# Patient Record
Sex: Female | Born: 1950 | Race: Black or African American | Hispanic: No | Smoking: Never smoker
Health system: Southern US, Community
[De-identification: ages and names within clinical notes are randomized; demographics above are authoritative.]

## PROBLEM LIST (undated history)

## (undated) DIAGNOSIS — N63 Unspecified lump in unspecified breast: Secondary | ICD-10-CM

## (undated) DIAGNOSIS — Z87898 Personal history of other specified conditions: Secondary | ICD-10-CM

## (undated) DIAGNOSIS — E785 Hyperlipidemia, unspecified: Secondary | ICD-10-CM

## (undated) DIAGNOSIS — H409 Unspecified glaucoma: Secondary | ICD-10-CM

## (undated) DIAGNOSIS — H25019 Cortical age-related cataract, unspecified eye: Secondary | ICD-10-CM

## (undated) DIAGNOSIS — I10 Essential (primary) hypertension: Secondary | ICD-10-CM

## (undated) DIAGNOSIS — G43909 Migraine, unspecified, not intractable, without status migrainosus: Secondary | ICD-10-CM

## (undated) DIAGNOSIS — R112 Nausea with vomiting, unspecified: Secondary | ICD-10-CM

## (undated) DIAGNOSIS — Z9229 Personal history of other drug therapy: Secondary | ICD-10-CM

## (undated) DIAGNOSIS — Z9889 Other specified postprocedural states: Secondary | ICD-10-CM

## (undated) DIAGNOSIS — Z9289 Personal history of other medical treatment: Secondary | ICD-10-CM

## (undated) DIAGNOSIS — F32A Depression, unspecified: Secondary | ICD-10-CM

## (undated) DIAGNOSIS — E119 Type 2 diabetes mellitus without complications: Secondary | ICD-10-CM

## (undated) DIAGNOSIS — E079 Disorder of thyroid, unspecified: Secondary | ICD-10-CM

## (undated) DIAGNOSIS — Z8742 Personal history of other diseases of the female genital tract: Secondary | ICD-10-CM

## (undated) DIAGNOSIS — K589 Irritable bowel syndrome without diarrhea: Secondary | ICD-10-CM

## (undated) DIAGNOSIS — G2581 Restless legs syndrome: Secondary | ICD-10-CM

## (undated) DIAGNOSIS — M1990 Unspecified osteoarthritis, unspecified site: Secondary | ICD-10-CM

## (undated) DIAGNOSIS — J45909 Unspecified asthma, uncomplicated: Secondary | ICD-10-CM

## (undated) HISTORY — DX: Personal history of other medical treatment: Z92.89

## (undated) HISTORY — DX: Type 2 diabetes mellitus without complications: E11.9

## (undated) HISTORY — DX: Cortical age-related cataract, unspecified eye: H25.019

## (undated) HISTORY — DX: Depression, unspecified: F32.A

## (undated) HISTORY — DX: Disorder of thyroid, unspecified: E07.9

## (undated) HISTORY — DX: Personal history of other diseases of the female genital tract: Z87.42

## (undated) HISTORY — DX: Essential (primary) hypertension: I10

## (undated) HISTORY — DX: Personal history of other specified conditions: Z87.898

## (undated) HISTORY — DX: Other specified postprocedural states: R11.2

## (undated) HISTORY — DX: Migraine, unspecified, not intractable, without status migrainosus: G43.909

## (undated) HISTORY — DX: Other specified postprocedural states: Z98.890

## (undated) HISTORY — DX: Unspecified glaucoma: H40.9

## (undated) HISTORY — DX: Hyperlipidemia, unspecified: E78.5

## (undated) HISTORY — DX: Personal history of other drug therapy: Z92.29

## (undated) HISTORY — DX: Unspecified lump in unspecified breast: N63.0

## (undated) HISTORY — DX: Restless legs syndrome: G25.81

## (undated) HISTORY — DX: Unspecified asthma, uncomplicated: J45.909

## (undated) HISTORY — DX: Irritable bowel syndrome, unspecified: K58.9

## (undated) HISTORY — DX: Unspecified osteoarthritis, unspecified site: M19.90

---

## 1981-01-06 DIAGNOSIS — N8000 Endometriosis of the uterus, unspecified: Secondary | ICD-10-CM

## 1981-01-06 HISTORY — DX: Endometriosis of the uterus, unspecified: N80.00

## 2009-01-06 DIAGNOSIS — D3500 Benign neoplasm of unspecified adrenal gland: Secondary | ICD-10-CM

## 2009-01-06 HISTORY — DX: Benign neoplasm of unspecified adrenal gland: D35.00

## 2010-10-07 DIAGNOSIS — G4730 Sleep apnea, unspecified: Secondary | ICD-10-CM

## 2010-10-07 HISTORY — DX: Sleep apnea, unspecified: G47.30

## 2011-02-21 DIAGNOSIS — L719 Rosacea, unspecified: Secondary | ICD-10-CM

## 2011-02-21 DIAGNOSIS — I10 Essential (primary) hypertension: Secondary | ICD-10-CM | POA: Insufficient documentation

## 2011-02-21 DIAGNOSIS — K219 Gastro-esophageal reflux disease without esophagitis: Secondary | ICD-10-CM

## 2011-02-21 HISTORY — DX: Gastro-esophageal reflux disease without esophagitis: K21.9

## 2011-02-21 HISTORY — DX: Rosacea, unspecified: L71.9

## 2011-11-07 DIAGNOSIS — Z8759 Personal history of other complications of pregnancy, childbirth and the puerperium: Secondary | ICD-10-CM

## 2011-11-07 HISTORY — DX: Personal history of other complications of pregnancy, childbirth and the puerperium: Z87.59

## 2014-06-22 DIAGNOSIS — R9082 White matter disease, unspecified: Secondary | ICD-10-CM

## 2014-06-22 HISTORY — DX: White matter disease, unspecified: R90.82

## 2014-07-06 DIAGNOSIS — E876 Hypokalemia: Secondary | ICD-10-CM | POA: Insufficient documentation

## 2014-07-11 DIAGNOSIS — N182 Chronic kidney disease, stage 2 (mild): Secondary | ICD-10-CM

## 2014-07-11 HISTORY — DX: Chronic kidney disease, stage 2 (mild): N18.2

## 2014-08-04 DIAGNOSIS — E039 Hypothyroidism, unspecified: Secondary | ICD-10-CM | POA: Insufficient documentation

## 2015-10-08 DIAGNOSIS — Z6839 Body mass index (BMI) 39.0-39.9, adult: Secondary | ICD-10-CM | POA: Insufficient documentation

## 2019-08-18 DIAGNOSIS — H7202 Central perforation of tympanic membrane, left ear: Secondary | ICD-10-CM | POA: Insufficient documentation

## 2020-02-23 DIAGNOSIS — M5000 Cervical disc disorder with myelopathy, unspecified cervical region: Secondary | ICD-10-CM | POA: Insufficient documentation

## 2020-02-23 DIAGNOSIS — M7500 Adhesive capsulitis of unspecified shoulder: Secondary | ICD-10-CM | POA: Insufficient documentation

## 2020-02-23 DIAGNOSIS — M797 Fibromyalgia: Secondary | ICD-10-CM | POA: Insufficient documentation

## 2020-02-23 DIAGNOSIS — M5412 Radiculopathy, cervical region: Secondary | ICD-10-CM | POA: Insufficient documentation

## 2020-11-14 ENCOUNTER — Inpatient Hospital Stay: Payer: Medicare Other

## 2020-11-14 ENCOUNTER — Other Ambulatory Visit: Payer: Self-pay

## 2020-11-14 ENCOUNTER — Encounter: Payer: Self-pay | Admitting: Oncology

## 2020-11-14 ENCOUNTER — Inpatient Hospital Stay: Payer: Medicare Other | Attending: Oncology | Admitting: Oncology

## 2020-11-14 VITALS — BP 94/67 | HR 87 | Temp 98.7°F | Resp 16 | Wt 210.0 lb

## 2020-11-14 DIAGNOSIS — Z833 Family history of diabetes mellitus: Secondary | ICD-10-CM | POA: Insufficient documentation

## 2020-11-14 DIAGNOSIS — Z8639 Personal history of other endocrine, nutritional and metabolic disease: Secondary | ICD-10-CM

## 2020-11-14 DIAGNOSIS — R109 Unspecified abdominal pain: Secondary | ICD-10-CM | POA: Diagnosis not present

## 2020-11-14 DIAGNOSIS — Z880 Allergy status to penicillin: Secondary | ICD-10-CM | POA: Insufficient documentation

## 2020-11-14 DIAGNOSIS — K589 Irritable bowel syndrome without diarrhea: Secondary | ICD-10-CM | POA: Insufficient documentation

## 2020-11-14 DIAGNOSIS — R110 Nausea: Secondary | ICD-10-CM | POA: Insufficient documentation

## 2020-11-14 DIAGNOSIS — I129 Hypertensive chronic kidney disease with stage 1 through stage 4 chronic kidney disease, or unspecified chronic kidney disease: Secondary | ICD-10-CM | POA: Insufficient documentation

## 2020-11-14 DIAGNOSIS — K219 Gastro-esophageal reflux disease without esophagitis: Secondary | ICD-10-CM | POA: Insufficient documentation

## 2020-11-14 DIAGNOSIS — E1122 Type 2 diabetes mellitus with diabetic chronic kidney disease: Secondary | ICD-10-CM | POA: Diagnosis not present

## 2020-11-14 DIAGNOSIS — Z885 Allergy status to narcotic agent status: Secondary | ICD-10-CM | POA: Insufficient documentation

## 2020-11-14 DIAGNOSIS — E785 Hyperlipidemia, unspecified: Secondary | ICD-10-CM | POA: Insufficient documentation

## 2020-11-14 DIAGNOSIS — D649 Anemia, unspecified: Secondary | ICD-10-CM | POA: Diagnosis not present

## 2020-11-14 DIAGNOSIS — R5383 Other fatigue: Secondary | ICD-10-CM | POA: Insufficient documentation

## 2020-11-14 DIAGNOSIS — R140 Abdominal distension (gaseous): Secondary | ICD-10-CM | POA: Insufficient documentation

## 2020-11-14 DIAGNOSIS — Z881 Allergy status to other antibiotic agents status: Secondary | ICD-10-CM | POA: Insufficient documentation

## 2020-11-14 DIAGNOSIS — Z836 Family history of other diseases of the respiratory system: Secondary | ICD-10-CM | POA: Diagnosis not present

## 2020-11-14 DIAGNOSIS — Z8269 Family history of other diseases of the musculoskeletal system and connective tissue: Secondary | ICD-10-CM | POA: Diagnosis not present

## 2020-11-14 DIAGNOSIS — R768 Other specified abnormal immunological findings in serum: Secondary | ICD-10-CM | POA: Diagnosis not present

## 2020-11-14 DIAGNOSIS — Z8349 Family history of other endocrine, nutritional and metabolic diseases: Secondary | ICD-10-CM | POA: Insufficient documentation

## 2020-11-14 DIAGNOSIS — N189 Chronic kidney disease, unspecified: Secondary | ICD-10-CM | POA: Diagnosis not present

## 2020-11-14 DIAGNOSIS — Z818 Family history of other mental and behavioral disorders: Secondary | ICD-10-CM | POA: Insufficient documentation

## 2020-11-14 DIAGNOSIS — Z803 Family history of malignant neoplasm of breast: Secondary | ICD-10-CM | POA: Insufficient documentation

## 2020-11-14 DIAGNOSIS — Z8249 Family history of ischemic heart disease and other diseases of the circulatory system: Secondary | ICD-10-CM | POA: Insufficient documentation

## 2020-11-14 DIAGNOSIS — Z882 Allergy status to sulfonamides status: Secondary | ICD-10-CM | POA: Insufficient documentation

## 2020-11-14 LAB — IRON AND TIBC
Iron: 85 ug/dL (ref 28–170)
Saturation Ratios: 22 % (ref 10.4–31.8)
TIBC: 391 ug/dL (ref 250–450)
UIBC: 306 ug/dL

## 2020-11-14 LAB — CBC WITH DIFFERENTIAL/PLATELET
Abs Immature Granulocytes: 0.03 10*3/uL (ref 0.00–0.07)
Basophils Absolute: 0 10*3/uL (ref 0.0–0.1)
Basophils Relative: 1 %
Eosinophils Absolute: 0 10*3/uL (ref 0.0–0.5)
Eosinophils Relative: 1 %
HCT: 29 % — ABNORMAL LOW (ref 36.0–46.0)
Hemoglobin: 8.7 g/dL — ABNORMAL LOW (ref 12.0–15.0)
Immature Granulocytes: 1 %
Lymphocytes Relative: 45 %
Lymphs Abs: 2 10*3/uL (ref 0.7–4.0)
MCH: 28.5 pg (ref 26.0–34.0)
MCHC: 30 g/dL (ref 30.0–36.0)
MCV: 95.1 fL (ref 80.0–100.0)
Monocytes Absolute: 0.3 10*3/uL (ref 0.1–1.0)
Monocytes Relative: 8 %
Neutro Abs: 1.8 10*3/uL (ref 1.7–7.7)
Neutrophils Relative %: 44 %
Platelets: 314 10*3/uL (ref 150–400)
RBC: 3.05 MIL/uL — ABNORMAL LOW (ref 3.87–5.11)
RDW: 17.5 % — ABNORMAL HIGH (ref 11.5–15.5)
WBC: 4.2 10*3/uL (ref 4.0–10.5)
nRBC: 6 % — ABNORMAL HIGH (ref 0.0–0.2)

## 2020-11-14 LAB — COMPREHENSIVE METABOLIC PANEL
ALT: 16 U/L (ref 0–44)
AST: 23 U/L (ref 15–41)
Albumin: 3.6 g/dL (ref 3.5–5.0)
Alkaline Phosphatase: 58 U/L (ref 38–126)
Anion gap: 7 (ref 5–15)
BUN: 21 mg/dL (ref 8–23)
CO2: 28 mmol/L (ref 22–32)
Calcium: 9.4 mg/dL (ref 8.9–10.3)
Chloride: 95 mmol/L — ABNORMAL LOW (ref 98–111)
Creatinine, Ser: 1.54 mg/dL — ABNORMAL HIGH (ref 0.44–1.00)
GFR, Estimated: 37 mL/min — ABNORMAL LOW (ref >60)
Glucose, Bld: 75 mg/dL (ref 70–99)
Potassium: 4.2 mmol/L (ref 3.5–5.1)
Sodium: 130 mmol/L — ABNORMAL LOW (ref 135–145)
Total Bilirubin: 0.5 mg/dL (ref 0.3–1.2)
Total Protein: 11.2 g/dL — ABNORMAL HIGH (ref 6.5–8.1)

## 2020-11-14 LAB — VITAMIN B12: Vitamin B-12: 277 pg/mL (ref 180–914)

## 2020-11-14 LAB — RETICULOCYTES
Immature Retic Fract: 28.3 % — ABNORMAL HIGH (ref 2.3–15.9)
RBC.: 3.05 MIL/uL — ABNORMAL LOW (ref 3.87–5.11)
Retic Count, Absolute: 60.7 10*3/uL (ref 19.0–186.0)
Retic Ct Pct: 2 % (ref 0.4–3.1)

## 2020-11-14 LAB — FERRITIN: Ferritin: 70 ng/mL (ref 11–307)

## 2020-11-14 LAB — FOLATE: Folate: 14.2 ng/mL (ref >5.9)

## 2020-11-14 LAB — LACTATE DEHYDROGENASE: LDH: 133 U/L (ref 98–192)

## 2020-11-14 NOTE — Progress Notes (Signed)
Hematology/Oncology Consult note [facility] [phone] [phone]  Patient Care Team: Chetty, Golden Pop, MD as PCP - General (Family Medicine)   Name of the patient: Karen Mcintosh  [id number]  1950-07-16    Reason for referral- anemia   Referring physician- Dr. Manuela Schwartz  Date of visit: 11/14/20   History of presenting illness- Patient is a 70 year old female with a past medical history significant for GERD type 2 diabetes hypertension hyperlipidemia among other medical problems.  She has been referred to [place] for anemia.  Most recently her H&H was 8.3/27.3 and has been gradually drifting down over the last 1 year.  In February 2021 her hemoglobin was 11.8 and then went down to 9.8 10.5 and presently 8.3.  Iron studies in July 2022 was normal.  TSH in September 2022 was normal.  CMP has shown elevated total protein levels since February 2021 and presently they are high at 10.1.  Creatinine mildly abnormal at 1.2.  Patient currently complains of fatigue.   Appetite and weight have remained stable.  She also complains of chronic nausea bloating and constipation.  ECOG PS- 1  Pain scale- 0   Review of systems- Review of Systems  Constitutional:  Positive for malaise/fatigue. Negative for chills, fever and weight loss.  HENT:  Negative for congestion, ear discharge and nosebleeds.   Eyes:  Negative for blurred vision.  Respiratory:  Negative for cough, hemoptysis, sputum production, shortness of breath and wheezing.   Cardiovascular:  Negative for chest pain, palpitations, orthopnea and claudication.  Gastrointestinal:  Positive for abdominal pain. Negative for blood in stool, constipation, diarrhea, heartburn, melena, nausea and vomiting.  Genitourinary:  Negative for dysuria, flank pain, frequency, hematuria and urgency.  Musculoskeletal:  Negative for back pain, joint pain and myalgias.  Skin:  Negative for rash.  Neurological:  Negative  for dizziness, tingling, focal weakness, seizures, weakness and headaches.  Endo/Heme/Allergies:  Does not bruise/bleed easily.  Psychiatric/Behavioral:  Negative for depression and suicidal ideas. The patient does not have insomnia.    Allergies  Allergen Reactions   Lidocaine-Methyl Sal-Camphor Shortness Of Breath and Swelling   Amitriptyline Swelling   Dilaudid [Hydromorphone] Itching   Eggs Or Egg-Derived Products Nausea And Vomiting   Lactose Intolerance (Gi) Nausea And Vomiting    vomiting   Mercaptobenzothiazole Itching    Depends on where it is used as to what side effect   Metformin And Related Other (See Comments)    constipation   Sulfa Antibiotics Hives and Swelling    Swelling of joints Tolerated oral cephalosporins in the past   Latex Rash    Also rubber, spandex, lycra   Penicillins Rash    There are no problems to display for this patient.    Past Medical History:  Diagnosis Date   Arthritis    Asthma    Breast mass in female    in 2019 and 2020   Cataract cortical, senile    CRF (chronic renal failure), stage 2 (mild) 07/11/2014   Depression    Diabetes mellitus type 2, uncomplicated ([place])    Endometriosis of uterus 01/06/1981   GERD (gastroesophageal reflux disease) 02/21/2011   Glaucoma    History of abnormal cervical Pap smear    History of blood transfusion    History of galactorrhea 11/07/2011   History of motion sickness    when she is in a boat   History of tamoxifen therapy    history of breast pain for 5 years  Hyperlipidemia    Hypertension    IBS (irritable bowel syndrome)    Migraine headache    Pheochromocytoma 01/2009   resected surgically   PONV (postoperative nausea and vomiting)    Restless leg syndrome    Rosacea 02/21/2011   Sleep apnea 10/2010   Thyroid disease    White matter disease 06/22/2014     History reviewed. No pertinent surgical history.  Social History   Socioeconomic History   Marital status:  Divorced    Spouse name: Not on file   Number of children: 1   Years of education: Not on file   Highest education level: Bachelor's degree (e.g., BA, AB, BS)  Occupational History   Not on file  Tobacco Use   Smoking status: Never    Passive exposure: Never   Smokeless tobacco: Never  Vaping Use   Vaping Use: Never used  Substance and Sexual Activity   Alcohol use: Never   Drug use: Not Currently   Sexual activity: Yes  Other Topics Concern   Not on file  Social History Narrative   Not on file   Social Determinants of Health   Financial Resource Strain: Not on file  Food Insecurity: Not on file  Transportation Needs: Not on file  Physical Activity: Not on file  Stress: Not on file  Social Connections: Not on file  Intimate Partner Violence: Not on file     Family History  Problem Relation Age of Onset   Hypertension Mother    Alzheimer's disease Mother    Thyroid disease Mother    Goiter Mother    Hypertension Father    Diabetes Father    Heart attack Father    Hypertension Sister    Sleep disorder Sister    Alzheimer's disease Maternal Aunt    Alzheimer's disease Maternal Uncle    Hypertension Daughter    Bipolar disorder Daughter    Asthma Daughter    Breast cancer Cousin    Breast cancer Niece    Multiple sclerosis Niece      Current Outpatient Medications:    albuterol (VENTOLIN HFA) 108 (90 Base) MCG/ACT inhaler, Inhale 2 puffs into the lungs every 6 (six) hours as needed for wheezing or shortness of breath., Disp: , Rfl:    amLODipine (NORVASC) 5 MG tablet, Take 5 mg by mouth daily., Disp: , Rfl:    aspirin EC 81 MG tablet, Take 81 mg by mouth daily. Swallow whole., Disp: , Rfl:    atorvastatin (LIPITOR) 20 MG tablet, Take 10 mg by mouth daily., Disp: , Rfl:    Azelastine HCl 137 MCG/SPRAY SOLN, Place 2 sprays into the nose 2 (two) times daily., Disp: , Rfl:    bimatoprost (LUMIGAN) 0.01 % SOLN, Place 1 drop into both eyes at bedtime., Disp: , Rfl:     Blood Glucose-BP Monitor (BLOOD GLUCOSE-WRIST BP MONITOR) DEVI, 1 application by Does not apply route in the morning, at noon, and at bedtime., Disp: , Rfl:    cyclobenzaprine (FLEXERIL) 5 MG tablet, Take 5 mg by mouth 3 (three) times daily as needed for muscle spasms., Disp: , Rfl:    DULoxetine (CYMBALTA) 60 MG capsule, Take 60 mg by mouth daily., Disp: , Rfl:    frovatriptan (FROVA) 2.5 MG tablet, Take 2.5 mg by mouth as needed for migraine (1 tablet bid x 3 days to abort prolonger migraine). If recurs, may repeat after 2 hours. Max of 3 tabs in 24 hours., Disp: , Rfl:  gabapentin (NEURONTIN) 300 MG capsule, Take 300 mg by mouth 3 (three) times daily., Disp: , Rfl:    glucose blood test strip, 1 each by Other route in the morning, at noon, and at bedtime. Use as instructed, Disp: , Rfl:    halobetasol (ULTRAVATE) 0.05 % cream, Apply 1 application topically 2 (two) times daily as needed (to affected area)., Disp: , Rfl:    hydrOXYzine (ATARAX/VISTARIL) 10 MG tablet, Take 10 mg by mouth 2 (two) times daily as needed for itching., Disp: , Rfl:    insulin aspart (NOVOLOG FLEXPEN) 100 UNIT/ML FlexPen, Inject 2-10 Units into the skin 3 (three) times daily with meals. Based on sugar levele, Disp: , Rfl:    insulin degludec (TRESIBA FLEXTOUCH) 100 UNIT/ML FlexTouch Pen, Inject 32 Units into the skin daily., Disp: , Rfl:    ketoconazole (NIZORAL) 2 % shampoo, Apply 1 application topically 2 (two) times a week. When she washes her hair, Disp: , Rfl:    levothyroxine (SYNTHROID) 112 MCG tablet, Take 112 mcg by mouth daily before breakfast., Disp: , Rfl:    losartan (COZAAR) 100 MG tablet, Take 100 mg by mouth daily., Disp: , Rfl:    omeprazole (PRILOSEC) 40 MG capsule, Take 40 mg by mouth daily., Disp: , Rfl:    propranolol ER (INDERAL LA) 60 MG 24 hr capsule, Take 60 mg by mouth at bedtime., Disp: , Rfl:    QUEtiapine (SEROQUEL) 25 MG tablet, Take 25-50 mg by mouth at bedtime. 1-2 tablets PRN for  rescue treatment and sleep during severe migraine, Disp: , Rfl:    spironolactone (ALDACTONE) 25 MG tablet, Take 25 mg by mouth daily., Disp: , Rfl:    Physical exam:  Vitals:   11/14/20 1552  BP: 94/67  Pulse: 87  Resp: 16  Temp: 98.7 F (37.1 C)  TempSrc: Oral  Weight: 210 lb (95.3 kg)   Physical Exam Constitutional:      General: She is not in acute distress. Cardiovascular:     Rate and Rhythm: Normal rate and regular rhythm.     Heart sounds: Normal heart sounds.  Pulmonary:     Effort: Pulmonary effort is normal.     Breath sounds: Normal breath sounds.  Abdominal:     General: Bowel sounds are normal.     Palpations: Abdomen is soft.  Skin:    General: Skin is warm and dry.  Neurological:     Mental Status: She is alert and oriented to person, place, and time.       Assessment and plan- Patient is a 71 y.o. female referred for normocytic anemia  Patient has had a gradually worsening anemia over the last 1 year.  No clear evidence of iron deficiency.  Elevated total protein as well as mild CKD is concerning for possible myeloma.  I will check ferritin and iron studies B12 folate reticulocyte count haptoglobin LDH myeloma panel serum free light chains and random urine protein electrophoresis at this time.  If labs find out in the direction of myeloma I would like her to get a PET CT scan as well as bone marrow biopsy and I will see her following all the results are back   Thank you for this kind referral and the opportunity to participate in the care of this patient   Visit Diagnosis 1. Normocytic anemia   2. History of non anemic vitamin B12 deficiency     Dr. Randa Evens, MD, MPH [facility] at [facility] [phone]  11/14/2020

## 2020-11-14 NOTE — Progress Notes (Signed)
New pt. For anemia. Pt is fatigued all the time. Tried oral iron and then developed tarry stools. Pt thought she had bleeding. Come off the tablets and the stools went back to normal and the pt did not get any energy or feel better when she was on the iron pills. She eats good and urine BM good. Sleeps good sometimes and other times not so ood due to restless leg syndrome and sleep apnea

## 2020-11-15 LAB — KAPPA/LAMBDA LIGHT CHAINS
Kappa free light chain: 88.9 mg/L — ABNORMAL HIGH (ref 3.3–19.4)
Kappa, lambda light chain ratio: 13.89 — ABNORMAL HIGH (ref 0.26–1.65)
Lambda free light chains: 6.4 mg/L (ref 5.7–26.3)

## 2020-11-15 LAB — HAPTOGLOBIN: Haptoglobin: 243 mg/dL (ref 37–355)

## 2020-11-16 LAB — PROTEIN ELECTRO, RANDOM URINE
Albumin ELP, Urine: 42.5 %
Alpha-1-Globulin, U: 1.8 %
Alpha-2-Globulin, U: 8.2 %
Beta Globulin, U: 36.3 %
Gamma Globulin, U: 11.2 %
M Component, Ur: 8 % — ABNORMAL HIGH
Total Protein, Urine: 12.4 mg/dL

## 2020-11-18 ENCOUNTER — Encounter: Payer: Self-pay | Admitting: Oncology

## 2020-11-19 ENCOUNTER — Telehealth: Payer: Self-pay | Admitting: *Deleted

## 2020-11-19 LAB — MULTIPLE MYELOMA PANEL, SERUM
Albumin SerPl Elph-Mcnc: 4 g/dL (ref 2.9–4.4)
Albumin/Glob SerPl: 0.6 — ABNORMAL LOW (ref 0.7–1.7)
Alpha 1: 0.3 g/dL (ref 0.0–0.4)
Alpha2 Glob SerPl Elph-Mcnc: 1.2 g/dL — ABNORMAL HIGH (ref 0.4–1.0)
B-Globulin SerPl Elph-Mcnc: 1.1 g/dL (ref 0.7–1.3)
Gamma Glob SerPl Elph-Mcnc: 4.3 g/dL — ABNORMAL HIGH (ref 0.4–1.8)
Globulin, Total: 7 g/dL — ABNORMAL HIGH (ref 2.2–3.9)
IgA: 19 mg/dL — ABNORMAL LOW (ref 87–352)
IgG (Immunoglobin G), Serum: 6099 mg/dL — ABNORMAL HIGH (ref 586–1602)
IgM (Immunoglobulin M), Srm: 18 mg/dL — ABNORMAL LOW (ref 26–217)
M Protein SerPl Elph-Mcnc: 4 g/dL — ABNORMAL HIGH
Total Protein ELP: 11 g/dL — ABNORMAL HIGH (ref 6.0–8.5)

## 2020-11-19 NOTE — Telephone Encounter (Signed)
Called pt and let her know that dr Janese Banks has rec: bone marrow bx with her creat. Is little abnormal and the kappa lambda is little abnormal and she wants pt to have bx because of that. The IR dept has a opening on 11/30/2020  [facility] 9 am procedure. NPO for 8 hours prior to the procedure. You must have a driver to bring pt home. Also have appt for her on 11/17 and it has been moved to time 3:45 and pt is good with this

## 2020-11-22 ENCOUNTER — Other Ambulatory Visit: Payer: Self-pay

## 2020-11-22 ENCOUNTER — Inpatient Hospital Stay (HOSPITAL_BASED_OUTPATIENT_CLINIC_OR_DEPARTMENT_OTHER): Payer: Medicare Other | Admitting: Oncology

## 2020-11-22 ENCOUNTER — Telehealth: Payer: Self-pay | Admitting: *Deleted

## 2020-11-22 ENCOUNTER — Other Ambulatory Visit: Payer: Self-pay | Admitting: *Deleted

## 2020-11-22 DIAGNOSIS — D649 Anemia, unspecified: Secondary | ICD-10-CM

## 2020-11-22 DIAGNOSIS — R778 Other specified abnormalities of plasma proteins: Secondary | ICD-10-CM | POA: Diagnosis not present

## 2020-11-22 DIAGNOSIS — R768 Other specified abnormal immunological findings in serum: Secondary | ICD-10-CM

## 2020-11-22 NOTE — Telephone Encounter (Signed)
Called pt per Dr. Janese Banks after her video visit Dr. Janese Banks had wanted her to do 24 hour urine. When I spoke to pt she has collected this before and she wants to come get it tom. So that she can bring it back on the day of pet scan. I told her that I will leave a bag with her name on it at the front desk where you register at. She is agreeable and will come pick it up

## 2020-11-26 NOTE — Progress Notes (Signed)
Patient on schedule for BMB 11/30/2020, called and spoke with patient on phone with pre procedure instructions given. Made aware to be here @ 0800, NPO after Mn prior to procedure as well as driver post procedure/recovery/discharge. Stated understanding.

## 2020-11-27 ENCOUNTER — Other Ambulatory Visit: Payer: Self-pay | Admitting: Radiology

## 2020-11-28 ENCOUNTER — Ambulatory Visit
Admission: RE | Admit: 2020-11-28 | Discharge: 2020-11-28 | Disposition: A | Discharge status: Home / Self Care | Payer: Medicare Other | Source: Ambulatory Visit | Attending: Oncology | Admitting: Oncology

## 2020-11-28 ENCOUNTER — Other Ambulatory Visit: Payer: Self-pay | Admitting: Radiology

## 2020-11-28 DIAGNOSIS — R768 Other specified abnormal immunological findings in serum: Secondary | ICD-10-CM

## 2020-11-28 DIAGNOSIS — R918 Other nonspecific abnormal finding of lung field: Secondary | ICD-10-CM | POA: Diagnosis not present

## 2020-11-28 DIAGNOSIS — I700 Atherosclerosis of aorta: Secondary | ICD-10-CM | POA: Insufficient documentation

## 2020-11-28 LAB — GLUCOSE, CAPILLARY
Glucose-Capillary: 101 mg/dL — ABNORMAL HIGH (ref 70–99)
Glucose-Capillary: 93 mg/dL (ref 70–99)

## 2020-11-28 MED ORDER — FLUDEOXYGLUCOSE F - 18 (FDG) INJECTION
10.9000 | Freq: Once | INTRAVENOUS | Status: AC | PRN
  Administered 2020-11-28: 11.88 via INTRAVENOUS

## 2020-11-30 ENCOUNTER — Other Ambulatory Visit
Admission: RE | Admit: 2020-11-30 | Discharge: 2020-11-30 | Disposition: A | Discharge status: Home / Self Care | Payer: Medicare Other | Source: Ambulatory Visit | Attending: Oncology | Admitting: Oncology

## 2020-11-30 ENCOUNTER — Ambulatory Visit
Admission: RE | Admit: 2020-11-30 | Discharge: 2020-11-30 | Disposition: A | Discharge status: Home / Self Care | Payer: Medicare Other | Source: Ambulatory Visit | Attending: Oncology | Admitting: Oncology

## 2020-11-30 ENCOUNTER — Other Ambulatory Visit: Payer: Self-pay

## 2020-11-30 ENCOUNTER — Encounter: Payer: Self-pay | Admitting: Oncology

## 2020-11-30 DIAGNOSIS — R768 Other specified abnormal immunological findings in serum: Secondary | ICD-10-CM

## 2020-11-30 DIAGNOSIS — C9000 Multiple myeloma not having achieved remission: Secondary | ICD-10-CM | POA: Insufficient documentation

## 2020-11-30 DIAGNOSIS — D72819 Decreased white blood cell count, unspecified: Secondary | ICD-10-CM | POA: Insufficient documentation

## 2020-11-30 DIAGNOSIS — D649 Anemia, unspecified: Secondary | ICD-10-CM | POA: Insufficient documentation

## 2020-11-30 LAB — CBC WITH DIFFERENTIAL/PLATELET
Abs Immature Granulocytes: 0.04 10*3/uL (ref 0.00–0.07)
Basophils Absolute: 0 10*3/uL (ref 0.0–0.1)
Basophils Relative: 1 %
Eosinophils Absolute: 0 10*3/uL (ref 0.0–0.5)
Eosinophils Relative: 1 %
HCT: 25.4 % — ABNORMAL LOW (ref 36.0–46.0)
Hemoglobin: 7.8 g/dL — ABNORMAL LOW (ref 12.0–15.0)
Immature Granulocytes: 1 %
Lymphocytes Relative: 45 %
Lymphs Abs: 1.5 10*3/uL (ref 0.7–4.0)
MCH: 28 pg (ref 26.0–34.0)
MCHC: 30.7 g/dL (ref 30.0–36.0)
MCV: 91 fL (ref 80.0–100.0)
Monocytes Absolute: 0.3 10*3/uL (ref 0.1–1.0)
Monocytes Relative: 9 %
Neutro Abs: 1.5 10*3/uL — ABNORMAL LOW (ref 1.7–7.7)
Neutrophils Relative %: 43 %
Platelets: 248 10*3/uL (ref 150–400)
RBC: 2.79 MIL/uL — ABNORMAL LOW (ref 3.87–5.11)
RDW: 17.6 % — ABNORMAL HIGH (ref 11.5–15.5)
Smear Review: NORMAL
WBC Morphology: ABNORMAL
WBC: 3.4 10*3/uL — ABNORMAL LOW (ref 4.0–10.5)
nRBC: 5.6 % — ABNORMAL HIGH (ref 0.0–0.2)

## 2020-11-30 LAB — PATHOLOGIST SMEAR REVIEW

## 2020-11-30 LAB — GLUCOSE, CAPILLARY: Glucose-Capillary: 100 mg/dL — ABNORMAL HIGH (ref 70–99)

## 2020-11-30 MED ORDER — HEPARIN SOD (PORK) LOCK FLUSH 100 UNIT/ML IV SOLN
INTRAVENOUS | Status: AC
  Filled 2020-11-30: qty 5

## 2020-11-30 MED ORDER — FENTANYL CITRATE (PF) 100 MCG/2ML IJ SOLN
INTRAMUSCULAR | Status: DC | PRN
  Administered 2020-11-30 (×2): 25 ug via INTRAVENOUS

## 2020-11-30 MED ORDER — MIDAZOLAM HCL 2 MG/2ML IJ SOLN
INTRAMUSCULAR | Status: DC | PRN
  Administered 2020-11-30 (×2): 1 mg via INTRAVENOUS

## 2020-11-30 MED ORDER — FENTANYL CITRATE (PF) 100 MCG/2ML IJ SOLN
INTRAMUSCULAR | Status: AC
  Filled 2020-11-30: qty 4

## 2020-11-30 MED ORDER — SODIUM CHLORIDE 0.9 % IV SOLN: INTRAVENOUS | Status: DC

## 2020-11-30 MED ORDER — FENTANYL CITRATE (PF) 100 MCG/2ML IJ SOLN
INTRAMUSCULAR | Status: AC
  Filled 2020-11-30: qty 2

## 2020-11-30 MED ORDER — MIDAZOLAM HCL 2 MG/2ML IJ SOLN
INTRAMUSCULAR | Status: AC
  Filled 2020-11-30: qty 2

## 2020-11-30 MED ORDER — MIDAZOLAM HCL 2 MG/2ML IJ SOLN
INTRAMUSCULAR | Status: AC
  Filled 2020-11-30: qty 4

## 2020-11-30 NOTE — Procedures (Signed)
Interventional Radiology Procedure Note  Procedure: CT BM ASP AND CORE BX    Complications: None  Estimated Blood Loss:  MIN  Findings: 11 G CORE AND ASP    M. TREVOR Terrill Wauters, MD    

## 2020-11-30 NOTE — H&P (Signed)
Chief Complaint: Patient was seen in consultation today for bone marrow biopsy at the request of [place] C  Referring Physician(s): Rao,Archana C  Supervising Physician: Daryll Brod  Patient Status: [facility] - Out-pt  History of Present Illness: Karen Mcintosh is a 70 y.o. female being worked up for possible myeloma. She is referred for image guided bone marrow biopsy. PMHx, meds, labs, imaging, allergies reviewed. Feels well, no recent fevers, chills, illness. Has been NPO today as directed.   Past Medical History:  Diagnosis Date   Arthritis    Asthma    Breast mass in female    in 2019 and 2020   Cataract cortical, senile    CRF (chronic renal failure), stage 2 (mild) 07/11/2014   Depression    Diabetes mellitus type 2, uncomplicated ([place])    Endometriosis of uterus 01/06/1981   GERD (gastroesophageal reflux disease) 02/21/2011   Glaucoma    History of abnormal cervical Pap smear    History of blood transfusion    History of galactorrhea 11/07/2011   History of motion sickness    when she is in a boat   History of tamoxifen therapy    history of breast pain for 5 years   Hyperlipidemia    Hypertension    IBS (irritable bowel syndrome)    Migraine headache    Pheochromocytoma 01/2009   resected surgically   PONV (postoperative nausea and vomiting)    Restless leg syndrome    Rosacea 02/21/2011   Sleep apnea 10/2010   Thyroid disease    White matter disease 06/22/2014    History reviewed. No pertinent surgical history.  Allergies: Lidocaine-methyl sal-camphor, Amitriptyline, Dilaudid [hydromorphone], Eggs or egg-derived products, Lactose intolerance (gi), Mercaptobenzothiazole, Metformin and related, Sulfa antibiotics, Latex, and Penicillins  Medications: Prior to Admission medications   Medication Sig Start Date End Date Taking? Authorizing Provider  albuterol (VENTOLIN HFA) 108 (90 Base) MCG/ACT inhaler Inhale 2 puffs into the lungs every 6  (six) hours as needed for wheezing or shortness of breath.    [provider]  amLODipine (NORVASC) 5 MG tablet Take 5 mg by mouth daily.    [provider]  aspirin EC 81 MG tablet Take 81 mg by mouth daily. Swallow whole.    [provider]  atorvastatin (LIPITOR) 20 MG tablet Take 10 mg by mouth daily.    [provider]  Azelastine HCl 137 MCG/SPRAY SOLN Place 2 sprays into the nose 2 (two) times daily.    [provider]  bimatoprost (LUMIGAN) 0.01 % SOLN Place 1 drop into both eyes at bedtime.    [provider]  Blood Glucose-BP Monitor (BLOOD GLUCOSE-WRIST BP MONITOR) DEVI 1 application by Does not apply route in the morning, at noon, and at bedtime.    [provider]  cyclobenzaprine (FLEXERIL) 5 MG tablet Take 5 mg by mouth 3 (three) times daily as needed for muscle spasms.    [provider]  DULoxetine (CYMBALTA) 60 MG capsule Take 60 mg by mouth daily.    [provider]  frovatriptan (FROVA) 2.5 MG tablet Take 2.5 mg by mouth as needed for migraine (1 tablet bid x 3 days to abort prolonger migraine). If recurs, may repeat after 2 hours. Max of 3 tabs in 24 hours.    [provider]  gabapentin (NEURONTIN) 300 MG capsule Take 300 mg by mouth 3 (three) times daily.    [provider]  glucose blood test strip 1 each by  Other route in the morning, at noon, and at bedtime. Use as instructed    [provider]  halobetasol (ULTRAVATE) 0.05 % cream Apply 1 application topically 2 (two) times daily as needed (to affected area).    [provider]  hydrOXYzine (ATARAX/VISTARIL) 10 MG tablet Take 10 mg by mouth 2 (two) times daily as needed for itching.    [provider]  insulin aspart (NOVOLOG FLEXPEN) 100 UNIT/ML FlexPen Inject 2-10 Units into the skin 3 (three) times daily with meals. Based on sugar levele    [provider]  insulin degludec (TRESIBA  FLEXTOUCH) 100 UNIT/ML FlexTouch Pen Inject 32 Units into the skin daily.    [provider]  ketoconazole (NIZORAL) 2 % shampoo Apply 1 application topically 2 (two) times a week. When she washes her hair    [provider]  levothyroxine (SYNTHROID) 112 MCG tablet Take 112 mcg by mouth daily before breakfast.    [provider]  losartan (COZAAR) 100 MG tablet Take 100 mg by mouth daily.    [provider]  omeprazole (PRILOSEC) 40 MG capsule Take 40 mg by mouth daily.    [provider]  propranolol ER (INDERAL LA) 60 MG 24 hr capsule Take 60 mg by mouth at bedtime.    [provider]  QUEtiapine (SEROQUEL) 25 MG tablet Take 25-50 mg by mouth at bedtime. 1-2 tablets PRN for rescue treatment and sleep during severe migraine    [provider]  spironolactone (ALDACTONE) 25 MG tablet Take 25 mg by mouth daily.    [provider]     Family History  Problem Relation Age of Onset   Hypertension Mother    Alzheimer's disease Mother    Thyroid disease Mother    Goiter Mother    Hypertension Father    Diabetes Father    Heart attack Father    Hypertension Sister    Sleep disorder Sister    Alzheimer's disease Maternal Aunt    Alzheimer's disease Maternal Uncle    Hypertension Daughter    Bipolar disorder Daughter    Asthma Daughter    Breast cancer Cousin    Breast cancer Niece    Multiple sclerosis Niece     Social History   Socioeconomic History   Marital status: Divorced    Spouse name: Not on file   Number of children: 1   Years of education: Not on file   Highest education level: Bachelor's degree (e.g., BA, AB, BS)  Occupational History   Not on file  Tobacco Use   Smoking status: Never    Passive exposure: Never   Smokeless tobacco: Never  Vaping Use   Vaping Use: Never used  Substance and Sexual Activity   Alcohol use: Never   Drug use: Not Currently   Sexual activity: Yes  Other Topics  Concern   Not on file  Social History Narrative   Not on file   Social Determinants of Health   Financial Resource Strain: Not on file  Food Insecurity: Not on file  Transportation Needs: Not on file  Physical Activity: Not on file  Stress: Not on file  Social Connections: Not on file     Review of Systems: A 12 point ROS discussed and pertinent positives are indicated in the HPI above.  All other systems are negative.  Review of Systems  Vital Signs: BP (!) 127/91   Pulse 90   Temp 98.3 F (36.8 C) (Oral)  Resp 18   Ht '5\' 7"'  (1.702 m)   Wt 95.3 kg   SpO2 99%   BMI 32.89 kg/m   Physical Exam Constitutional:      Appearance: Normal appearance.  HENT:     Mouth/Throat:     Mouth: Mucous membranes are moist.     Pharynx: Oropharynx is clear.  Cardiovascular:     Rate and Rhythm: Normal rate and regular rhythm.     Heart sounds: Normal heart sounds.  Pulmonary:     Effort: Pulmonary effort is normal. No respiratory distress.     Breath sounds: Normal breath sounds.  Skin:    General: Skin is warm and dry.  Neurological:     General: No focal deficit present.     Mental Status: She is alert and oriented to person, place, and time.  Psychiatric:        Mood and Affect: Mood normal.        Thought Content: Thought content normal.        Judgment: Judgment normal.      Imaging: No results found.  Labs:  CBC: Recent Labs    11/14/20 1603 11/30/20 0824  WBC 4.2 PENDING  HGB 8.7* 7.8*  HCT 29.0* 25.4*  PLT 314 248    COAGS: No results for input(s): INR, APTT in the last 8760 hours.  BMP: Recent Labs    11/14/20 1603  NA 130*  K 4.2  CL 95*  CO2 28  GLUCOSE 75  BUN 21  CALCIUM 9.4  CREATININE 1.54*  GFRNONAA 37*    LIVER FUNCTION TESTS: Recent Labs    11/14/20 1603  BILITOT 0.5  AST 23  ALT 16  ALKPHOS 58  PROT 11.2*  ALBUMIN 3.6    TUMOR MARKERS: No results for input(s): AFPTM, CEA, CA199, CHROMGRNA in the last 8760  hours.  Assessment and Plan: Elevated serum immunoglobulin free light chain level Plan for image guided bone marrow biopsy Labs reviewed. Risks and benefits of bone marrow biopsy was discussed with the patient and/or patient's family including, but not limited to bleeding, infection, damage to adjacent structures or low yield requiring additional tests.  All of the questions were answered and there is agreement to proceed.  Consent signed and in chart.   Thank you for this interesting consult.  I greatly enjoyed meeting Karen Mcintosh and look forward to participating in their care.  A copy of this report was sent to the requesting provider on this date.  Electronically Signed: Ascencion Dike, PA-C 11/30/2020, 8:43 AM   I spent a total of 20 minutes in face to face in clinical consultation, greater than 50% of which was counseling/coordinating care for bone marrow biopsy

## 2020-11-30 NOTE — Progress Notes (Signed)
Patient states she "feels itchy" in places where EKG adhesive patches in place.  She states she has chronic itching issues with adhesive.  Removed EKG patches, cleansed residual adhesive from skin and no noted skin redness or breakdown.  Removed IV and tegaderm and cleansed residual adhesive.  Used paper tape for IV removal site dressing.  Patient states she has this chronically and brought with her today her prescription hydroxyzine 25 mg tablets and she took 2 of these tablets.  I reviewed the pill bottle and confirmed.  Have added adhesive to her allergy list with severe itching as issue.

## 2020-11-30 NOTE — Progress Notes (Signed)
I connected with Karen Mcintosh on 11/30/20 at  3:45 PM EST by video enabled telemedicine visit and verified that I am speaking with the correct person using two identifiers.   I discussed the limitations, risks, security and privacy concerns of performing an evaluation and management service by telemedicine and the availability of in-person appointments. I also discussed with the patient that there may be a patient responsible charge related to this service. The patient expressed understanding and agreed to proceed.  Other persons participating in the visit and their role in the encounter:  none  Patient's location:  home Provider's location:  home  Chief Complaint: Discuss results of blood work  History of present illness: Patient is a 70 year old female with a past medical history significant for GERD type 2 diabetes hypertension hyperlipidemia among other medical problems.  She has been referred to [place] for anemia.  Most recently her H&H was 8.3/27.3 and has been gradually drifting down over the last 1 year.  In February 2021 her hemoglobin was 11.8 and then went down to 9.8 10.5 and presently 8.3.  Iron studies in July 2022 was normal.  TSH in September 2022 was normal.  CMP has shown elevated total protein levels since February 2021 and presently they are high at 10.1.  Creatinine mildly abnormal at 1.2.  Results of blood work from 11/14/2020 were as follows: CBC showed an H&H of 8.7/29 CMP was significant for elevated total protein of 11.2 and creatinine of 1.54.  Calcium 9.4.  B12 folate ferritin and iron studies within normal limits although B12 was mildly lower at 277.  Myeloma panel showed elevated IgG of 6099 with an M protein of 4.0.  LDH normal haptoglobin normal.  Serum free light chain ratio abnormal at 13 with an elevated kappa free light chain of 88.9  Interval history: Reports on going fatigue but denies other complaints at this time  Review of Systems  Constitutional:  Positive  for malaise/fatigue. Negative for chills, fever and weight loss.  HENT:  Negative for congestion, ear discharge and nosebleeds.   Eyes:  Negative for blurred vision.  Respiratory:  Negative for cough, hemoptysis, sputum production, shortness of breath and wheezing.   Cardiovascular:  Negative for chest pain, palpitations, orthopnea and claudication.  Gastrointestinal:  Negative for abdominal pain, blood in stool, constipation, diarrhea, heartburn, melena, nausea and vomiting.  Genitourinary:  Negative for dysuria, flank pain, frequency, hematuria and urgency.  Musculoskeletal:  Negative for back pain, joint pain and myalgias.  Skin:  Negative for rash.  Neurological:  Negative for dizziness, tingling, focal weakness, seizures, weakness and headaches.  Endo/Heme/Allergies:  Does not bruise/bleed easily.  Psychiatric/Behavioral:  Negative for depression and suicidal ideas. The patient does not have insomnia.    Allergies  Allergen Reactions   Tape Itching    Adhesive items such as EKG patches.    Amitriptyline Swelling   Dilaudid [Hydromorphone] Itching   Eggs Or Egg-Derived Products Nausea And Vomiting   Lactose Intolerance (Gi) Nausea And Vomiting    vomiting   Mercaptobenzothiazole Itching    Depends on where it is used as to what side effect   Metformin And Related Other (See Comments)    constipation   Sulfa Antibiotics Hives and Swelling    Swelling of joints Tolerated oral cephalosporins in the past   Latex Rash    Also rubber, spandex, lycra   Penicillins Rash    Past Medical History:  Diagnosis Date   Arthritis    Asthma  Breast mass in female    in 2019 and 2020   Cataract cortical, senile    CRF (chronic renal failure), stage 2 (mild) 07/11/2014   Depression    Diabetes mellitus type 2, uncomplicated ([place])    Endometriosis of uterus 01/06/1981   GERD (gastroesophageal reflux disease) 02/21/2011   Glaucoma    History of abnormal cervical Pap smear    History  of blood transfusion    History of galactorrhea 11/07/2011   History of motion sickness    when she is in a boat   History of tamoxifen therapy    history of breast pain for 5 years   Hyperlipidemia    Hypertension    IBS (irritable bowel syndrome)    Migraine headache    Pheochromocytoma 01/2009   resected surgically   PONV (postoperative nausea and vomiting)    Restless leg syndrome    Rosacea 02/21/2011   Sleep apnea 10/2010   Thyroid disease    White matter disease 06/22/2014    No past surgical history on file.  Social History   Socioeconomic History   Marital status: Divorced    Spouse name: Not on file   Number of children: 1   Years of education: Not on file   Highest education level: Bachelor's degree (e.g., BA, AB, BS)  Occupational History   Not on file  Tobacco Use   Smoking status: Never    Passive exposure: Never   Smokeless tobacco: Never  Vaping Use   Vaping Use: Never used  Substance and Sexual Activity   Alcohol use: Never   Drug use: Not Currently   Sexual activity: Yes  Other Topics Concern   Not on file  Social History Narrative   Not on file   Social Determinants of Health   Financial Resource Strain: Not on file  Food Insecurity: Not on file  Transportation Needs: Not on file  Physical Activity: Not on file  Stress: Not on file  Social Connections: Not on file  Intimate Partner Violence: Not on file    Family History  Problem Relation Age of Onset   Hypertension Mother    Alzheimer's disease Mother    Thyroid disease Mother    Goiter Mother    Hypertension Father    Diabetes Father    Heart attack Father    Hypertension Sister    Sleep disorder Sister    Alzheimer's disease Maternal Aunt    Alzheimer's disease Maternal Uncle    Hypertension Daughter    Bipolar disorder Daughter    Asthma Daughter    Breast cancer Cousin    Breast cancer Niece    Multiple sclerosis Niece      Current Outpatient Medications:     albuterol (VENTOLIN HFA) 108 (90 Base) MCG/ACT inhaler, Inhale 2 puffs into the lungs every 6 (six) hours as needed for wheezing or shortness of breath., Disp: , Rfl:    amLODipine (NORVASC) 5 MG tablet, Take 5 mg by mouth daily., Disp: , Rfl:    aspirin EC 81 MG tablet, Take 81 mg by mouth daily. Swallow whole., Disp: , Rfl:    atorvastatin (LIPITOR) 20 MG tablet, Take 10 mg by mouth daily., Disp: , Rfl:    Azelastine HCl 137 MCG/SPRAY SOLN, Place 2 sprays into the nose 2 (two) times daily., Disp: , Rfl:    bimatoprost (LUMIGAN) 0.01 % SOLN, Place 1 drop into both eyes at bedtime., Disp: , Rfl:    Blood Glucose-BP Monitor (BLOOD  GLUCOSE-WRIST BP MONITOR) DEVI, 1 application by Does not apply route in the morning, at noon, and at bedtime., Disp: , Rfl:    cyclobenzaprine (FLEXERIL) 5 MG tablet, Take 5 mg by mouth 3 (three) times daily as needed for muscle spasms., Disp: , Rfl:    DULoxetine (CYMBALTA) 60 MG capsule, Take 60 mg by mouth daily., Disp: , Rfl:    frovatriptan (FROVA) 2.5 MG tablet, Take 2.5 mg by mouth as needed for migraine (1 tablet bid x 3 days to abort prolonger migraine). If recurs, may repeat after 2 hours. Max of 3 tabs in 24 hours., Disp: , Rfl:    gabapentin (NEURONTIN) 300 MG capsule, Take 300 mg by mouth 3 (three) times daily. (Patient not taking: Reported on 11/30/2020), Disp: , Rfl:    glucose blood test strip, 1 each by Other route in the morning, at noon, and at bedtime. Use as instructed, Disp: , Rfl:    halobetasol (ULTRAVATE) 0.05 % cream, Apply 1 application topically 2 (two) times daily as needed (to affected area)., Disp: , Rfl:    hydrOXYzine (ATARAX/VISTARIL) 10 MG tablet, Take 10 mg by mouth 2 (two) times daily as needed for itching., Disp: , Rfl:    insulin aspart (NOVOLOG FLEXPEN) 100 UNIT/ML FlexPen, Inject 2-10 Units into the skin 3 (three) times daily with meals. Based on sugar levele, Disp: , Rfl:    insulin degludec (TRESIBA FLEXTOUCH) 100 UNIT/ML FlexTouch  Pen, Inject 32 Units into the skin daily., Disp: , Rfl:    ketoconazole (NIZORAL) 2 % shampoo, Apply 1 application topically 2 (two) times a week. When she washes her hair, Disp: , Rfl:    levothyroxine (SYNTHROID) 112 MCG tablet, Take 112 mcg by mouth daily before breakfast., Disp: , Rfl:    losartan (COZAAR) 100 MG tablet, Take 100 mg by mouth daily., Disp: , Rfl:    omeprazole (PRILOSEC) 40 MG capsule, Take 40 mg by mouth daily., Disp: , Rfl:    propranolol ER (INDERAL LA) 60 MG 24 hr capsule, Take 60 mg by mouth at bedtime., Disp: , Rfl:    QUEtiapine (SEROQUEL) 25 MG tablet, Take 25-50 mg by mouth at bedtime. 1-2 tablets PRN for rescue treatment and sleep during severe migraine, Disp: , Rfl:    spironolactone (ALDACTONE) 25 MG tablet, Take 25 mg by mouth daily., Disp: , Rfl:  No current facility-administered medications for this visit.  Facility-Administered Medications Ordered in Other Visits:    0.9 %  sodium chloride infusion, , Intravenous, Continuous, Hedy Jacob, PA-C, Last Rate: 20 mL/hr at 11/30/20 0841, New Bag at 11/30/20 0841   fentaNYL (SUBLIMAZE) 100 MCG/2ML injection, , , ,    fentaNYL (SUBLIMAZE) injection, , Intravenous, PRN, Greggory Keen, MD, 25 mcg at 11/30/20 0925   heparin lock flush 100 UNIT/ML injection, , , ,    heparin lock flush 100 UNIT/ML injection, , , ,    heparin lock flush 100 UNIT/ML injection, , , ,    midazolam (VERSED) 2 MG/2ML injection, , , ,    midazolam (VERSED) injection, , Intravenous, PRN, Greggory Keen, MD, 1 mg at 11/30/20 0630  CT BONE MARROW BIOPSY & ASPIRATION  Result Date: 11/30/2020 INDICATION: Elevated serum immunoglobulin, free light chain level, concern for myeloma EXAM: CT GUIDED RIGHT ILIAC BONE MARROW ASPIRATION AND CORE BIOPSY Date:  11/30/2020 11/30/2020 9:50 am Radiologist:  Jerilynn Mages. Daryll Brod, MD Guidance:  CT FLUOROSCOPY TIME:  Fluoroscopy Time: None. MEDICATIONS: 1% lidocaine local ANESTHESIA/SEDATION: 2.0 mg IV  Versed; 100  mcg IV Fentanyl Moderate Sedation Time:  15 minute The patient was continuously monitored during the procedure by the interventional radiology nurse under my direct supervision. CONTRAST:  None. COMPLICATIONS: None PROCEDURE: Informed consent was obtained from the patient following explanation of the procedure, risks, benefits and alternatives. The patient understands, agrees and consents for the procedure. All questions were addressed. A time out was performed. The patient was positioned prone and non-contrast localization CT was performed of the pelvis to demonstrate the iliac marrow spaces. Maximal barrier sterile technique utilized including caps, mask, sterile gowns, sterile gloves, large sterile drape, hand hygiene, and Betadine prep. Under sterile conditions and local anesthesia, an 11 gauge coaxial bone biopsy needle was advanced into the right iliac marrow space. Needle position was confirmed with CT imaging. Initially, bone marrow aspiration was performed. Next, the 11 gauge outer cannula was utilized to obtain a right iliac bone marrow core biopsy. Needle was removed. Hemostasis was obtained with compression. The patient tolerated the procedure well. Samples were prepared with the cytotechnologist. No immediate complications. IMPRESSION: CT guided right iliac bone marrow aspiration and core biopsy. Electronically Signed   By: Jerilynn Mages.  Shick M.D.   On: 11/30/2020 09:53    No images are attached to the encounter.   CMP Latest Ref Rng & Units 11/14/2020  Glucose 70 - 99 mg/dL 75  BUN 8 - 23 mg/dL 21  Creatinine 0.44 - 1.00 mg/dL 1.54(H)  Sodium 135 - 145 mmol/L 130(L)  Potassium 3.5 - 5.1 mmol/L 4.2  Chloride 98 - 111 mmol/L 95(L)  CO2 22 - 32 mmol/L 28  Calcium 8.9 - 10.3 mg/dL 9.4  Total Protein 6.5 - 8.1 g/dL 11.2(H)  Total Bilirubin 0.3 - 1.2 mg/dL 0.5  Alkaline Phos 38 - 126 U/L 58  AST 15 - 41 U/L 23  ALT 0 - 44 U/L 16   CBC Latest Ref Rng & Units 11/30/2020  WBC 4.0 - 10.5 K/uL 3.4(L)   Hemoglobin 12.0 - 15.0 g/dL 7.8(L)  Hematocrit 36.0 - 46.0 % 25.4(L)  Platelets 150 - 400 K/uL 248     Observation/objective: Appears in no acute distress over video visit today.  Breathing is nonlabored  Assessment and plan: Patient is a 70 year old female referred for normocytic anemia with labs concerning for multiple myeloma  Explained to the patient that her hemoglobin is low at 8.4 along with AKI with a creatinine of 1.5 and elevated total protein.  Myeloma panel shows elevated IgG of 6 g an M protein of 4 g with a free light chain ratio of 13.  All these findings are concerning for multiple myeloma.  Patient is already scheduled for bone marrow biopsy as well as PET scan next week.I will see her back in a week to 10 days time after these results are back.  Discussed briefly that treatment for myeloma would involve chemoimmunotherapy based treatments and potential for bone marrow transplant in the future.  Treatment will be given with a palliative intent.  Patient understands and agrees to proceed as planned  Follow-up instructions: As above  I discussed the assessment and treatment plan with the patient. The patient was provided an opportunity to ask questions and all were answered. The patient agreed with the plan and demonstrated an understanding of the instructions.   The patient was advised to call back or seek an in-person evaluation if the symptoms worsen or if the condition fails to improve as anticipated. Visit Diagnosis: 1. Abnormal SPEP  Dr. Randa Evens, MD, MPH [place] [facility] Tel- [phone] 11/30/2020 11:56 AM

## 2020-12-03 LAB — UPEP/TP, 24-HR URINE
Albumin, U: 17.7 %
Alpha 1, Urine: 4.2 %
Alpha 2, Urine: 9.8 %
Beta, Urine: 37.7 %
Gamma Globulin, Urine: 30.5 %
Total Protein, Urine-Ur/day: 48 mg/24 hr (ref 30–150)
Total Protein, Urine: 7.4 mg/dL
Total Volume: 650

## 2020-12-05 ENCOUNTER — Other Ambulatory Visit: Payer: Self-pay

## 2020-12-05 ENCOUNTER — Inpatient Hospital Stay: Payer: Medicare Other | Admitting: Oncology

## 2020-12-05 ENCOUNTER — Encounter: Payer: Self-pay | Admitting: Oncology

## 2020-12-05 DIAGNOSIS — M5030 Other cervical disc degeneration, unspecified cervical region: Secondary | ICD-10-CM | POA: Insufficient documentation

## 2020-12-05 NOTE — Progress Notes (Unsigned)
Pt states her rt arm has been swollen for the past week or so seems to get better at times but then gets worse. Painful.

## 2020-12-06 ENCOUNTER — Telehealth: Payer: Self-pay | Admitting: *Deleted

## 2020-12-06 NOTE — Telephone Encounter (Signed)
Patient called today and left a message that she thinks more results are back and would like me to look at it and give her a call back.  I talked to Dr. Janese Banks and not everything is back what the diagnosis is now on the pathology report and patient has been given an appointment for 1045 tomorrow.  The patient is agreeable to the appointment for 10:45 on 12 /2

## 2020-12-07 ENCOUNTER — Encounter: Payer: Self-pay | Admitting: Oncology

## 2020-12-07 ENCOUNTER — Other Ambulatory Visit: Payer: Self-pay

## 2020-12-07 ENCOUNTER — Inpatient Hospital Stay: Payer: Medicare Other | Attending: Oncology | Admitting: Oncology

## 2020-12-07 VITALS — BP 118/79 | HR 100 | Temp 98.1°F | Resp 18 | Wt 213.5 lb

## 2020-12-07 DIAGNOSIS — R768 Other specified abnormal immunological findings in serum: Secondary | ICD-10-CM | POA: Diagnosis not present

## 2020-12-07 DIAGNOSIS — E119 Type 2 diabetes mellitus without complications: Secondary | ICD-10-CM | POA: Insufficient documentation

## 2020-12-07 DIAGNOSIS — Z882 Allergy status to sulfonamides status: Secondary | ICD-10-CM | POA: Diagnosis not present

## 2020-12-07 DIAGNOSIS — Z818 Family history of other mental and behavioral disorders: Secondary | ICD-10-CM | POA: Insufficient documentation

## 2020-12-07 DIAGNOSIS — R5383 Other fatigue: Secondary | ICD-10-CM | POA: Diagnosis not present

## 2020-12-07 DIAGNOSIS — I10 Essential (primary) hypertension: Secondary | ICD-10-CM | POA: Diagnosis not present

## 2020-12-07 DIAGNOSIS — Z885 Allergy status to narcotic agent status: Secondary | ICD-10-CM | POA: Diagnosis not present

## 2020-12-07 DIAGNOSIS — Z881 Allergy status to other antibiotic agents status: Secondary | ICD-10-CM | POA: Insufficient documentation

## 2020-12-07 DIAGNOSIS — Z8249 Family history of ischemic heart disease and other diseases of the circulatory system: Secondary | ICD-10-CM | POA: Diagnosis not present

## 2020-12-07 DIAGNOSIS — D649 Anemia, unspecified: Secondary | ICD-10-CM | POA: Diagnosis not present

## 2020-12-07 DIAGNOSIS — E785 Hyperlipidemia, unspecified: Secondary | ICD-10-CM | POA: Diagnosis not present

## 2020-12-07 DIAGNOSIS — R918 Other nonspecific abnormal finding of lung field: Secondary | ICD-10-CM | POA: Diagnosis not present

## 2020-12-07 DIAGNOSIS — Z8349 Family history of other endocrine, nutritional and metabolic diseases: Secondary | ICD-10-CM | POA: Insufficient documentation

## 2020-12-07 DIAGNOSIS — I2510 Atherosclerotic heart disease of native coronary artery without angina pectoris: Secondary | ICD-10-CM | POA: Diagnosis not present

## 2020-12-07 DIAGNOSIS — Z888 Allergy status to other drugs, medicaments and biological substances status: Secondary | ICD-10-CM | POA: Insufficient documentation

## 2020-12-07 DIAGNOSIS — N281 Cyst of kidney, acquired: Secondary | ICD-10-CM | POA: Diagnosis not present

## 2020-12-07 DIAGNOSIS — C9000 Multiple myeloma not having achieved remission: Secondary | ICD-10-CM | POA: Insufficient documentation

## 2020-12-07 DIAGNOSIS — Z880 Allergy status to penicillin: Secondary | ICD-10-CM | POA: Insufficient documentation

## 2020-12-07 DIAGNOSIS — Z86018 Personal history of other benign neoplasm: Secondary | ICD-10-CM | POA: Diagnosis not present

## 2020-12-07 DIAGNOSIS — Z833 Family history of diabetes mellitus: Secondary | ICD-10-CM | POA: Insufficient documentation

## 2020-12-07 DIAGNOSIS — Z79899 Other long term (current) drug therapy: Secondary | ICD-10-CM | POA: Insufficient documentation

## 2020-12-07 DIAGNOSIS — K219 Gastro-esophageal reflux disease without esophagitis: Secondary | ICD-10-CM | POA: Diagnosis not present

## 2020-12-07 DIAGNOSIS — G35 Multiple sclerosis: Secondary | ICD-10-CM | POA: Diagnosis not present

## 2020-12-07 DIAGNOSIS — Z803 Family history of malignant neoplasm of breast: Secondary | ICD-10-CM | POA: Diagnosis not present

## 2020-12-07 DIAGNOSIS — Z836 Family history of other diseases of the respiratory system: Secondary | ICD-10-CM | POA: Insufficient documentation

## 2020-12-07 DIAGNOSIS — Z7189 Other specified counseling: Secondary | ICD-10-CM

## 2020-12-07 NOTE — Progress Notes (Signed)
Pt states that her rt arm seems to be getting better as far as swelling. However, causing lots of pain.

## 2020-12-08 DIAGNOSIS — C9000 Multiple myeloma not having achieved remission: Secondary | ICD-10-CM | POA: Insufficient documentation

## 2020-12-08 DIAGNOSIS — Z7189 Other specified counseling: Secondary | ICD-10-CM | POA: Insufficient documentation

## 2020-12-08 NOTE — Progress Notes (Signed)
Hematology/Oncology Consult note [facility]  [phone] [phone]  Patient Care Team: Chetty, Golden Pop, MD as PCP - General (Family Medicine)   Name of the patient: Karen Mcintosh  [id number]  12/09/1950   Date of visit: 12/08/20  Diagnosis-new diagnosis of IgG kappa multiple myeloma.  R-ISS staging pending cytogenetic studies.  Chief complaint/ Reason for visit-discuss PET CT scan and bone marrow biopsy results and further management  Heme/Onc history: Patient is a 70 year old female with a past medical history significant for GERD type 2 diabetes hypertension hyperlipidemia among other medical problems.  She has been referred to [place] for anemia.  Most recently her H&H was 8.3/27.3 and has been gradually drifting down over the last 1 year.  In February 2021 her hemoglobin was 11.8 and then went down to 9.8, 10.5 and presently 8.3.  Iron studies in July 2022 was normal.  TSH in September 2022 was normal.  CMP has shown elevated total protein levels since February 2021 and presently they are high at 10.1.  Creatinine mildly abnormal at 1.2.   Results of blood work from 11/14/2020 were as follows: CBC showed an H&H of 8.7/29 CMP was significant for elevated total protein of 11.2 and creatinine of 1.54.  Calcium 9.4.  B12 folate ferritin and iron studies within normal limits although B12 was mildly lower at 277.  Myeloma panel showed elevated IgG of 6099 with an M protein of 4.0.  LDH normal haptoglobin normal.  Serum free light chain ratio abnormal at 13 with an elevated kappa free light chain of 88.9  PET CT scan on 11/28/2020 showed diffuse moth-eaten appearance to the calvarium and ribs nonspecific but compatible with diagnosis of multiple myeloma.  Mildly hypermetabolic nonenlarged mediastinal and bilateral hilar lymph nodes nonspecific probably reactive.  Subcentimeter pulmonary nodules below PET resolution.  No other hypermetabolic activity  to suggest skeletal metastases.  Bone marrow biopsy showed 23% plasma cells by manual differential count with atypical features including prominent nucleoli and multinucleation.  By CD138 IHC plasma cells 50% and are kappa restricted by light chain in situ hybridization.  Ring sideroblasts not identified storage iron diminished to absent   Interval history-patient is here with her husband today.  She had plans to go to [place] for 4 to 6 weeks in a few weeks.  She reports ongoing fatigue but denies other complaints.  ECOG PS- 1 Pain scale- 0   Review of systems- Review of Systems  Constitutional:  Positive for malaise/fatigue. Negative for chills, fever and weight loss.  HENT:  Negative for congestion, ear discharge and nosebleeds.   Eyes:  Negative for blurred vision.  Respiratory:  Negative for cough, hemoptysis, sputum production, shortness of breath and wheezing.   Cardiovascular:  Negative for chest pain, palpitations, orthopnea and claudication.  Gastrointestinal:  Negative for abdominal pain, blood in stool, constipation, diarrhea, heartburn, melena, nausea and vomiting.  Genitourinary:  Negative for dysuria, flank pain, frequency, hematuria and urgency.  Musculoskeletal:  Negative for back pain, joint pain and myalgias.  Skin:  Negative for rash.  Neurological:  Negative for dizziness, tingling, focal weakness, seizures, weakness and headaches.  Endo/Heme/Allergies:  Does not bruise/bleed easily.  Psychiatric/Behavioral:  Negative for depression and suicidal ideas. The patient does not have insomnia.      Allergies  Allergen Reactions   Tape Itching    Adhesive items such as EKG patches.    Amitriptyline Swelling   Dilaudid [Hydromorphone] Itching   Eggs Or Egg-Derived  Products Nausea And Vomiting   Lactose Intolerance (Gi) Nausea And Vomiting    vomiting   Mercaptobenzothiazole Itching    Depends on where it is used as to what side effect   Metformin And Related Other  (See Comments)    constipation   Sulfa Antibiotics Hives and Swelling    Swelling of joints Tolerated oral cephalosporins in the past   Latex Rash    Also rubber, spandex, lycra   Penicillins Rash     Past Medical History:  Diagnosis Date   Arthritis    Asthma    Breast mass in female    in 2019 and 2020   Cataract cortical, senile    CRF (chronic renal failure), stage 2 (mild) 07/11/2014   Depression    Diabetes mellitus type 2, uncomplicated ([place])    Endometriosis of uterus 01/06/1981   GERD (gastroesophageal reflux disease) 02/21/2011   Glaucoma    History of abnormal cervical Pap smear    History of blood transfusion    History of galactorrhea 11/07/2011   History of motion sickness    when she is in a boat   History of tamoxifen therapy    history of breast pain for 5 years   Hyperlipidemia    Hypertension    IBS (irritable bowel syndrome)    Migraine headache    Pheochromocytoma 01/2009   resected surgically   PONV (postoperative nausea and vomiting)    Restless leg syndrome    Rosacea 02/21/2011   Sleep apnea 10/2010   Thyroid disease    White matter disease 06/22/2014     History reviewed. No pertinent surgical history.  Social History   Socioeconomic History   Marital status: Divorced    Spouse name: Not on file   Number of children: 1   Years of education: Not on file   Highest education level: Bachelor's degree (e.g., BA, AB, BS)  Occupational History   Not on file  Tobacco Use   Smoking status: Never    Passive exposure: Never   Smokeless tobacco: Never  Vaping Use   Vaping Use: Never used  Substance and Sexual Activity   Alcohol use: Never   Drug use: Not Currently   Sexual activity: Yes  Other Topics Concern   Not on file  Social History Narrative   Not on file   Social Determinants of Health   Financial Resource Strain: Not on file  Food Insecurity: Not on file  Transportation Needs: Not on file  Physical Activity: Not on  file  Stress: Not on file  Social Connections: Not on file  Intimate Partner Violence: Not on file    Family History  Problem Relation Age of Onset   Hypertension Mother    Alzheimer's disease Mother    Thyroid disease Mother    Goiter Mother    Hypertension Father    Diabetes Father    Heart attack Father    Hypertension Sister    Sleep disorder Sister    Alzheimer's disease Maternal Aunt    Alzheimer's disease Maternal Uncle    Hypertension Daughter    Bipolar disorder Daughter    Asthma Daughter    Breast cancer Cousin    Breast cancer Niece    Multiple sclerosis Niece      Current Outpatient Medications:    albuterol (VENTOLIN HFA) 108 (90 Base) MCG/ACT inhaler, Inhale 2 puffs into the lungs every 6 (six) hours as needed for wheezing or shortness of breath., Disp: ,  Rfl:    amLODipine (NORVASC) 5 MG tablet, Take 5 mg by mouth daily., Disp: , Rfl:    aspirin EC 81 MG tablet, Take 81 mg by mouth daily. Swallow whole., Disp: , Rfl:    atorvastatin (LIPITOR) 20 MG tablet, Take 10 mg by mouth daily., Disp: , Rfl:    Azelastine HCl 137 MCG/SPRAY SOLN, Place 2 sprays into the nose 2 (two) times daily., Disp: , Rfl:    bimatoprost (LUMIGAN) 0.01 % SOLN, Place 1 drop into both eyes at bedtime., Disp: , Rfl:    Blood Glucose-BP Monitor (BLOOD GLUCOSE-WRIST BP MONITOR) DEVI, 1 application by Does not apply route in the morning, at noon, and at bedtime., Disp: , Rfl:    cyclobenzaprine (FLEXERIL) 5 MG tablet, Take 5 mg by mouth 3 (three) times daily as needed for muscle spasms., Disp: , Rfl:    DULoxetine (CYMBALTA) 60 MG capsule, Take 60 mg by mouth daily., Disp: , Rfl:    frovatriptan (FROVA) 2.5 MG tablet, Take 2.5 mg by mouth as needed for migraine (1 tablet bid x 3 days to abort prolonger migraine). If recurs, may repeat after 2 hours. Max of 3 tabs in 24 hours., Disp: , Rfl:    glucose blood test strip, 1 each by Other route in the morning, at noon, and at bedtime. Use as  instructed, Disp: , Rfl:    halobetasol (ULTRAVATE) 0.05 % cream, Apply 1 application topically 2 (two) times daily as needed (to affected area)., Disp: , Rfl:    hydrOXYzine (ATARAX/VISTARIL) 10 MG tablet, Take 10 mg by mouth 2 (two) times daily as needed for itching., Disp: , Rfl:    insulin aspart (NOVOLOG FLEXPEN) 100 UNIT/ML FlexPen, Inject 2-10 Units into the skin 3 (three) times daily with meals. Based on sugar levele, Disp: , Rfl:    insulin degludec (TRESIBA FLEXTOUCH) 100 UNIT/ML FlexTouch Pen, Inject 32 Units into the skin daily., Disp: , Rfl:    ketoconazole (NIZORAL) 2 % shampoo, Apply 1 application topically 2 (two) times a week. When she washes her hair, Disp: , Rfl:    levothyroxine (SYNTHROID) 112 MCG tablet, Take 112 mcg by mouth daily before breakfast., Disp: , Rfl:    losartan (COZAAR) 100 MG tablet, Take 100 mg by mouth daily., Disp: , Rfl:    omeprazole (PRILOSEC) 40 MG capsule, Take 40 mg by mouth daily., Disp: , Rfl:    propranolol ER (INDERAL LA) 60 MG 24 hr capsule, Take 60 mg by mouth at bedtime., Disp: , Rfl:    QUEtiapine (SEROQUEL) 25 MG tablet, Take 25-50 mg by mouth at bedtime. 1-2 tablets PRN for rescue treatment and sleep during severe migraine, Disp: , Rfl:    spironolactone (ALDACTONE) 25 MG tablet, Take 25 mg by mouth daily., Disp: , Rfl:    gabapentin (NEURONTIN) 300 MG capsule, Take 300 mg by mouth 3 (three) times daily. (Patient not taking: Reported on 11/30/2020), Disp: , Rfl:   Physical exam:  Vitals:   12/07/20 1047  BP: 118/79  Pulse: 100  Resp: 18  Temp: 98.1 F (36.7 C)  SpO2: 100%  Weight: 213 lb 8 oz (96.8 kg)   Physical Exam Constitutional:      General: She is not in acute distress. Cardiovascular:     Rate and Rhythm: Normal rate and regular rhythm.     Heart sounds: Normal heart sounds.  Pulmonary:     Effort: Pulmonary effort is normal.     Breath sounds: Normal breath  sounds.  Abdominal:     General: Bowel sounds are normal.      Palpations: Abdomen is soft.  Skin:    General: Skin is warm and dry.  Neurological:     Mental Status: She is alert and oriented to person, place, and time.     CMP Latest Ref Rng & Units 11/14/2020  Glucose 70 - 99 mg/dL 75  BUN 8 - 23 mg/dL 21  Creatinine 0.44 - 1.00 mg/dL 1.54(H)  Sodium 135 - 145 mmol/L 130(L)  Potassium 3.5 - 5.1 mmol/L 4.2  Chloride 98 - 111 mmol/L 95(L)  CO2 22 - 32 mmol/L 28  Calcium 8.9 - 10.3 mg/dL 9.4  Total Protein 6.5 - 8.1 g/dL 11.2(H)  Total Bilirubin 0.3 - 1.2 mg/dL 0.5  Alkaline Phos 38 - 126 U/L 58  AST 15 - 41 U/L 23  ALT 0 - 44 U/L 16   CBC Latest Ref Rng & Units 11/30/2020  WBC 4.0 - 10.5 K/uL 3.4(L)  Hemoglobin 12.0 - 15.0 g/dL 7.8(L)  Hematocrit 36.0 - 46.0 % 25.4(L)  Platelets 150 - 400 K/uL 248    No images are attached to the encounter.  NM PET Image Initial (PI) Whole Body  Result Date: 11/30/2020 CLINICAL DATA:  Initial treatment strategy for elevated serum immunoglobulin free light chain level, possible multiple myeloma. 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 EXAM: NUCLEAR MEDICINE PET WHOLE BODY TECHNIQUE: 11.9 mCi F-18 FDG was injected intravenously. Full-ring PET imaging was performed from the head to foot after the radiotracer. CT data was obtained and used for attenuation correction and anatomic localization. Fasting blood glucose: 101 mg/dl COMPARISON:  None. FINDINGS: Mediastinal blood pool activity: SUV max 2.9 HEAD/NECK: No hypermetabolic activity in the scalp. No hypermetabolic cervical lymph nodes. Incidental CT findings: none CHEST: Mildly hypermetabolic nonenlarged 0.7 cm subcarinal lymph node with max SUV 5.3 (series 3/image 133). Mildly hypermetabolic nonenlarged 0.7 cm AP window node with max SUV 4.2 (series 3/image 127). Mildly hypermetabolic nonenlarged bilateral hilar lymph nodes with max SUV 5.5 in the right hilum and max SUV 4.7 in the left hilum. No hypermetabolic pulmonary findings. No  enlarged or hypermetabolic axillary nodes. Incidental CT findings: Coronary atherosclerosis. Minimally atherosclerotic nonaneurysmal thoracic aorta. A few scattered small solid pulmonary nodules in the lungs bilaterally, largest 0.4 cm in the left upper lobe (series 3/image 129), all below PET resolution. ABDOMEN/PELVIS: No abnormal hypermetabolic activity within the liver, pancreas, right adrenal gland, or spleen. No hypermetabolic lymph nodes in the abdomen or pelvis. Incidental CT findings: Cholecystectomy. Left adrenalectomy, with no evidence of recurrent mass in the left adrenalectomy bed. Simple 1.7 cm medial lower left renal cyst. Mildly atherosclerotic nonaneurysmal abdominal aorta. Hysterectomy. SKELETON: No focal hypermetabolic activity to suggest skeletal metastasis. Incidental CT findings: Diffuse moth eaten appearance to the calvarium and ribs. ACDF C5-7. Mild superior L1 vertebral compression fracture of uncertain chronicity. Marked thoracolumbar spondylosis. EXTREMITIES: No abnormal hypermetabolic activity in the lower extremities. Incidental CT findings: none IMPRESSION: 1. No evidence of hypermetabolic myeloma. 2. Diffuse moth-eaten appearance to the calvarium and ribs, nonspecific, compatible with a diagnosis of multiple myeloma. 3. Mildly hypermetabolic nonenlarged mediastinal and bilateral hilar lymph nodes, nonspecific, more likely reactive. 4. A few scattered small solid bilateral pulmonary nodules, largest 0.4 cm, all below PET resolution. Suggest attention on follow-up chest CT in 3-6 months. 5.  Aortic Atherosclerosis (ICD10-I70.0). Electronically Signed   By: Ilona Sorrel M.D.   On: 11/30/2020 13:12   CT BONE  MARROW BIOPSY & ASPIRATION  Result Date: 11/30/2020 INDICATION: Elevated serum immunoglobulin, free light chain level, concern for myeloma EXAM: CT GUIDED RIGHT ILIAC BONE MARROW ASPIRATION AND CORE BIOPSY Date:  11/30/2020 11/30/2020 9:50 am Radiologist:  Jerilynn Mages. Daryll Brod, MD  Guidance:  CT FLUOROSCOPY TIME:  Fluoroscopy Time: None. MEDICATIONS: 1% lidocaine local ANESTHESIA/SEDATION: 2.0 mg IV Versed; 100 mcg IV Fentanyl Moderate Sedation Time:  15 minute The patient was continuously monitored during the procedure by the interventional radiology nurse under my direct supervision. CONTRAST:  None. COMPLICATIONS: None PROCEDURE: Informed consent was obtained from the patient following explanation of the procedure, risks, benefits and alternatives. The patient understands, agrees and consents for the procedure. All questions were addressed. A time out was performed. The patient was positioned prone and non-contrast localization CT was performed of the pelvis to demonstrate the iliac marrow spaces. Maximal barrier sterile technique utilized including caps, mask, sterile gowns, sterile gloves, large sterile drape, hand hygiene, and Betadine prep. Under sterile conditions and local anesthesia, an 11 gauge coaxial bone biopsy needle was advanced into the right iliac marrow space. Needle position was confirmed with CT imaging. Initially, bone marrow aspiration was performed. Next, the 11 gauge outer cannula was utilized to obtain a right iliac bone marrow core biopsy. Needle was removed. Hemostasis was obtained with compression. The patient tolerated the procedure well. Samples were prepared with the cytotechnologist. No immediate complications. IMPRESSION: CT guided right iliac bone marrow aspiration and core biopsy. Electronically Signed   By: Jerilynn Mages.  Shick M.D.   On: 11/30/2020 09:53     Assessment and plan- Patient is a 70 y.o. female with new diagnosis of IgG kappa multiple myeloma  Patient's recent hemoglobin is 7.8 and there is no other reversible cause of anemia found on her blood work.  Although bone marrow reserves of iron were diminished to absent her iron studies are normal with a ferritin of 70And iron saturation of 22%.  B12 folate reticulocyte count haptoglobin LDH were all  normal.  Myeloma panel does show elevated IgG of 6099 with an M protein of 4 g IgG kappa.  Serum free light chain ratio abnormal at 13.89 with an elevated serum 88.9.  Serum creatinine is elevated at 1.5 with an elevated total protein of 11.2.  PET CT scan shows moth-eaten appearance of calvarium and ribs which would be compatible with diagnosis of multiple myeloma.  Bone marrow biopsy shows 23% plasma cells by manual count and 50% plasma cells positive for CD138 by IHC.  All these findings are compatible with IgG kappa multiple myeloma.  We are still awaiting cytogenetic and FISH studiesFor further risk stratification.  Her albumin is normal and she will need beta-2 microglobulin checked for ISS staging.  24-hour urine protein electrophoresis did not show any evidence of M spike.  Discussed natural history of multiple myeloma and potential complications in the future if not treated soon.  Patient is already significantly anemic.  Patient does have multiple comorbidities including hypertension diabetes history of multiple sclerosis and hypothyroidism but overall has a performance status of 1.  Discussed that myeloma as such is not considered curative but the goal of myeloma treatment is to put her in remission and she will likely require some kind of treatment lifelong as maintenance regardless of whether she pursues her bone marrow transplant.  Combination daratumumab Velcade Revlimid dexamethasone regimen as per the [place] trial would seem like a reasonable option for her and she could be a potential bone marrow transplant candidate  down the line as well.  Upon further conversation with the patient today she tells me that she lives an hour away and lives only about 10 to 15 minutes away from [facility].  We discussed her treatment preferences about coming here an hour away versus initiating treatment at [facility].  Patient prefers to go to [facility] for further treatment.  I we will therefore refer the patient to Dr. Amalia Hailey  for further discussion about her myeloma treatment.  Patient understands that there would be a role for potential bone marrow transplant down the line if she is deemed to be a transplant candidate.   Total face to face encounter time for this patient visit was 50 min.  >50% of time spent in counselling and coordination of care   Visit Diagnosis 1. Multiple myeloma not having achieved remission ([place])   2. Goals of care, counseling/discussion      Dr. Randa Evens, MD, MPH [facility] [phone] 12/08/2020 8:40 AM

## 2020-12-10 ENCOUNTER — Encounter (HOSPITAL_COMMUNITY): Payer: Self-pay | Admitting: Oncology

## 2020-12-10 ENCOUNTER — Telehealth: Payer: Self-pay

## 2020-12-10 LAB — SURGICAL PATHOLOGY

## 2020-12-10 NOTE — Telephone Encounter (Signed)
Referral was sent to [facility] for Dr. Amalia Hailey. Dr. Janese Banks spoke to provider in regards to pts condition. Referral was sent to [facility].

## 2020-12-10 NOTE — Progress Notes (Signed)
I sent it to [employer] with all the reports and notes and demographics and insurance

## 2020-12-12 ENCOUNTER — Encounter (HOSPITAL_COMMUNITY): Payer: Self-pay | Admitting: Oncology

## 2020-12-12 ENCOUNTER — Encounter (HOSPITAL_COMMUNITY): Payer: Self-pay

## 2020-12-12 NOTE — Progress Notes (Signed)
Report has been faxed to [employer] the coordinator and received fax confirmation. [phone].

## 2023-03-14 IMAGING — PT NM PET TUM IMG INITIAL (PI) WHOLE BODY
8 of 10 series · 18 of 25 positions shown · non-contrast
Comparison: None.

CLINICAL DATA: Initial treatment strategy for elevated serum
immunoglobulin free light chain level, possible multiple myeloma.
History of left adrenalectomy 10 years prior for pheochromocytoma.

EXAM:
NUCLEAR MEDICINE PET WHOLE BODY
TECHNIQUE: 11.9 mCi F-18 FDG was injected intravenously. Full-ring PET imaging
was performed from the head to foot after the radiotracer. CT data
was obtained and used for attenuation correction and anatomic
localization.
Fasting blood glucose: 101 mg/dl

[Series 3: ct wb 5.0 b30f · axial · 5.0mm · 0.98mm/px · z∈[-1760,+46]mm · 3 of 603 slices shown]
[im 1/603  soft-tissue]
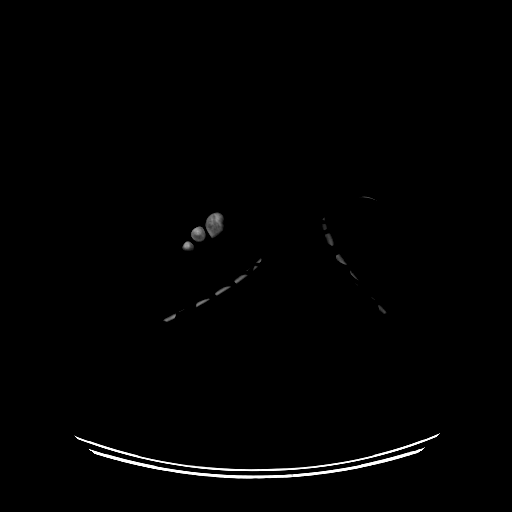
[im 402/603  soft-tissue]
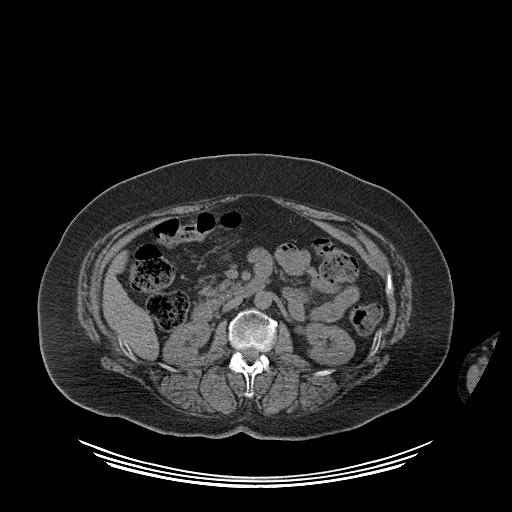
[im 603/603  soft-tissue]
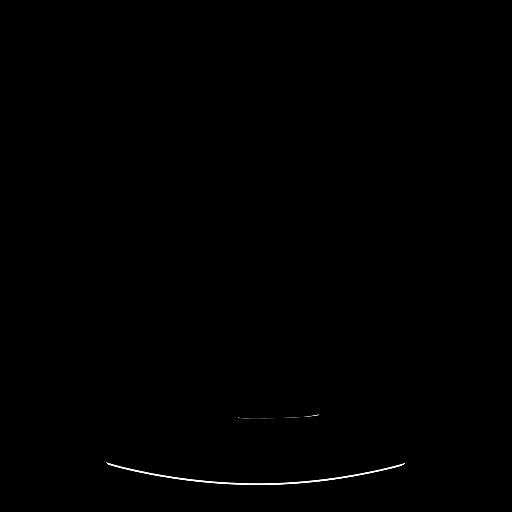

[Series 5: pet wb uncorrected (nac) · axial · 5.0mm · 4.07mm/px · z∈[-1760,+46]mm · 3 of 603 slices shown]
[im 1/603]
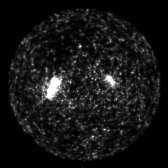
[im 402/603]
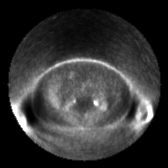
[im 603/603]
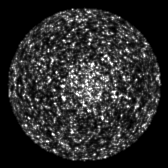

[Series 6: pet wb (ac) · axial · 5.0mm · 4.07mm/px · z∈[-1160,+46]mm · 3 of 603 slices shown]
[im 201/603]
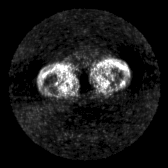
[im 402/603]
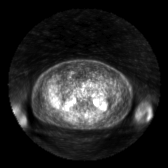
[im 603/603]
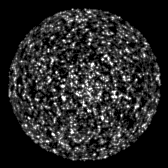

[Series 603: pet axial fused · 3 of 598 slices shown]
[im 200/598]
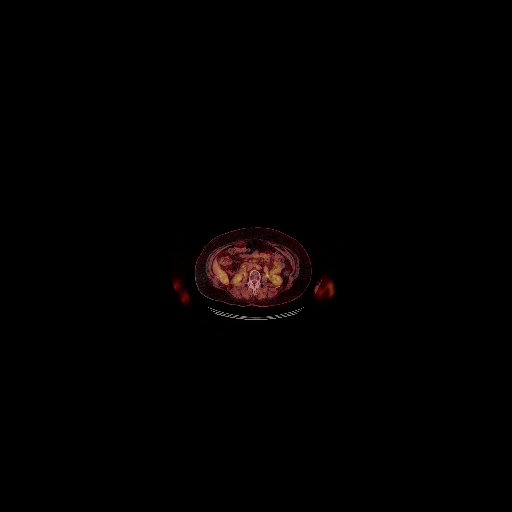
[im 399/598]
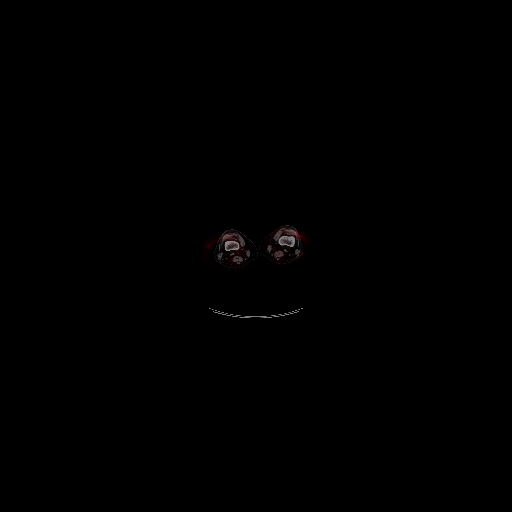
[im 598/598]
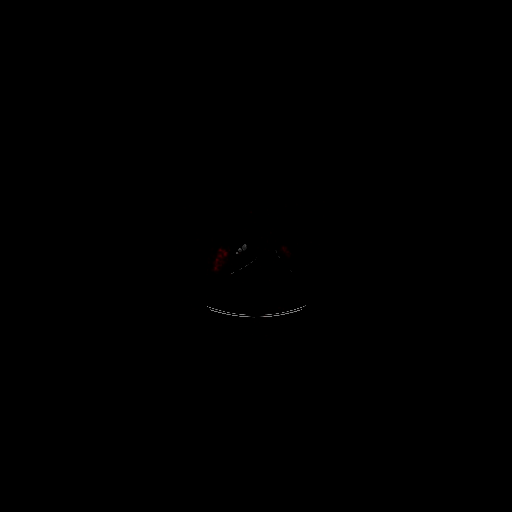

[Series 605: pet sagittal fused · 1 of 199 slices shown]
[im 1/199]
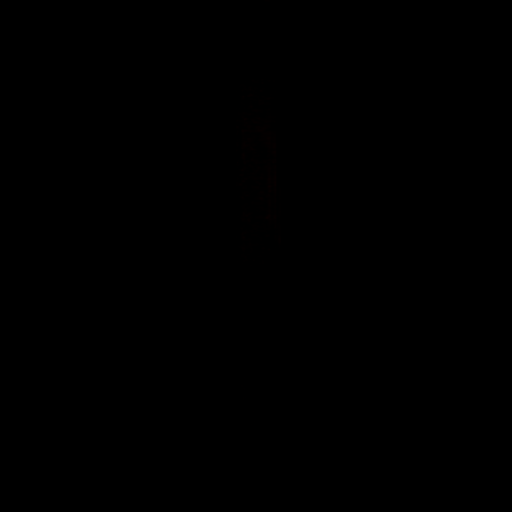

[Series 606: pet axial · 3 of 599 slices shown]
[im 1/599]
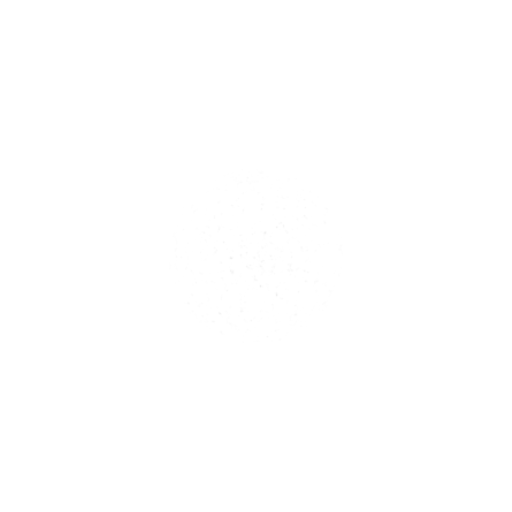
[im 399/599]
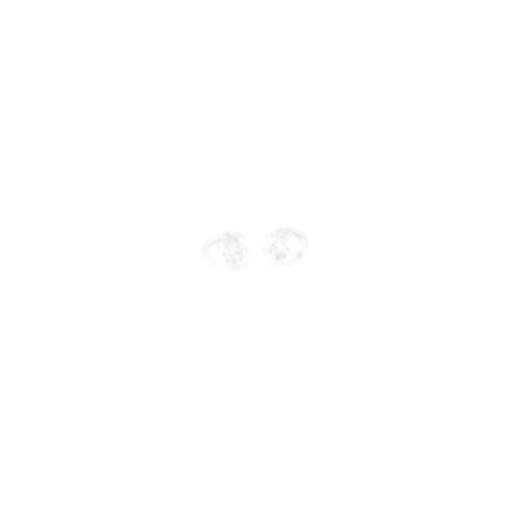
[im 599/599]
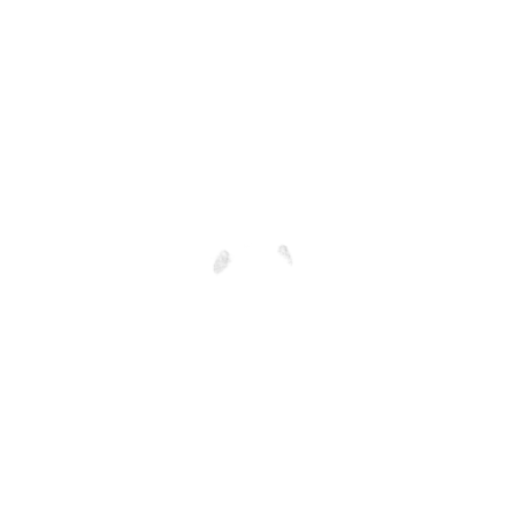

[Series 607: pet coronal · 1 of 107 slices shown]
[im 1/107]
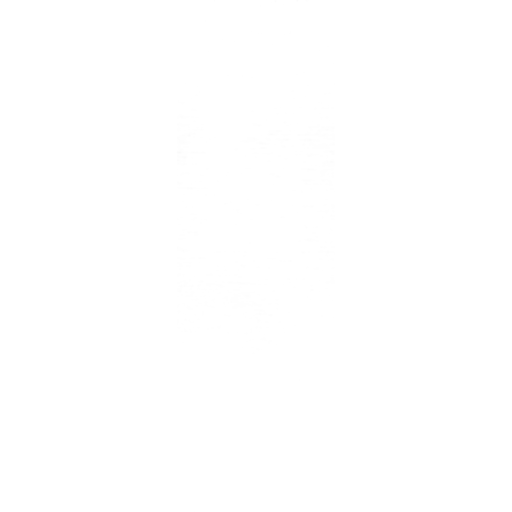

[Series 1109: results mm oncology reading · 1.0mm · 0.44mm/px · 1 of 5 slices shown]
[im 1/5]
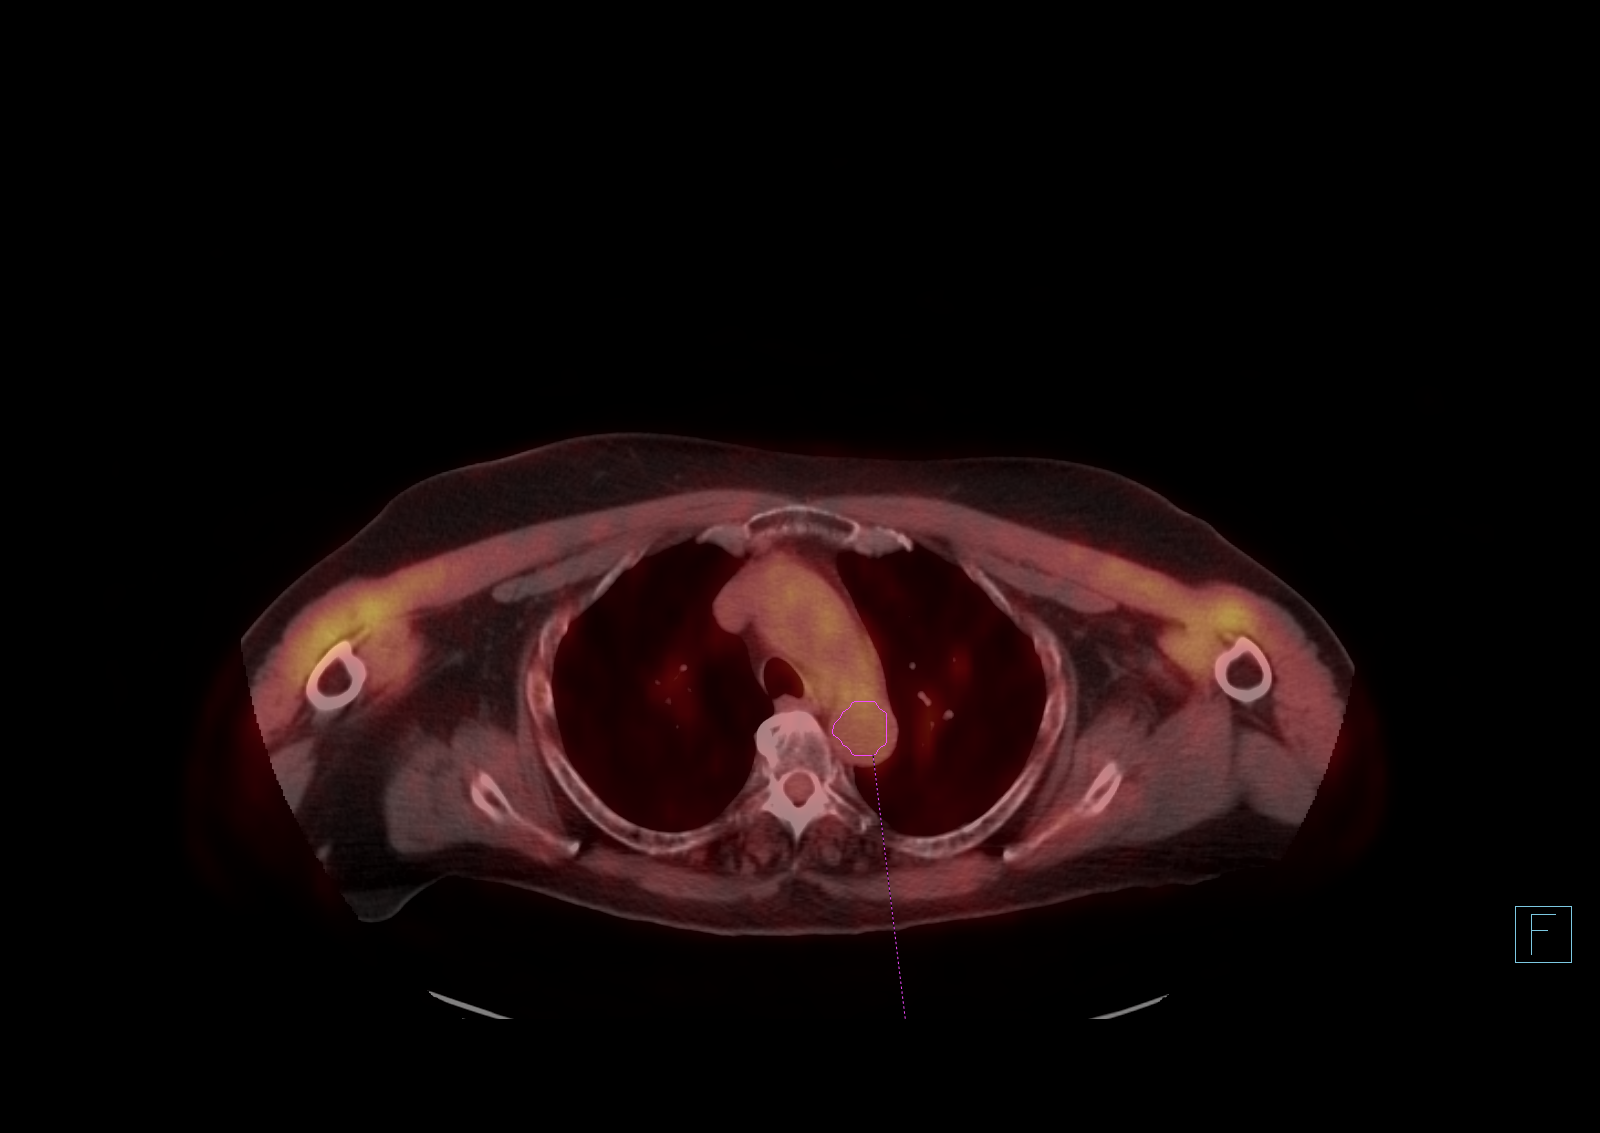

[18 of 25 positions shown; findings below may reference images not displayed]

FINDINGS: Mediastinal blood pool activity: SUV max

HEAD/NECK: No hypermetabolic activity in the scalp. No
hypermetabolic cervical lymph nodes.

Incidental CT findings: none

CHEST:

Mildly hypermetabolic nonenlarged 0.7 cm subcarinal lymph node with
max SUV 5.3 (series 3/image 133). Mildly hypermetabolic nonenlarged
0.7 cm AP window node with max SUV 4.2 (series 3/image 127). Mildly
hypermetabolic nonenlarged bilateral hilar lymph nodes with max SUV
5.5 in the right hilum and max SUV 4.7 in the left hilum.

No hypermetabolic pulmonary findings. No enlarged or hypermetabolic
axillary nodes.

Incidental CT findings: Coronary atherosclerosis. Minimally
atherosclerotic nonaneurysmal thoracic aorta. A few scattered small
solid pulmonary nodules in the lungs bilaterally, largest 0.4 cm in
the left upper lobe (series 3/image 129), all below PET resolution.

ABDOMEN/PELVIS: No abnormal hypermetabolic activity within the
liver, pancreas, right adrenal gland, or spleen. No hypermetabolic
lymph nodes in the abdomen or pelvis.

Incidental CT findings: Cholecystectomy. Left adrenalectomy, with no
evidence of recurrent mass in the left adrenalectomy bed. Simple
cm medial lower left renal cyst. Mildly atherosclerotic
nonaneurysmal abdominal aorta. Hysterectomy.

SKELETON: No focal hypermetabolic activity to suggest skeletal
metastasis.

Incidental CT findings: Diffuse moth eaten appearance to the
calvarium and ribs. ACDF C5-7. Mild superior L1 vertebral
compression fracture of uncertain chronicity. Marked thoracolumbar
spondylosis.

EXTREMITIES: No abnormal hypermetabolic activity in the lower
extremities.

Incidental CT findings: none
IMPRESSION: 1. No evidence of hypermetabolic myeloma.
2. Diffuse moth-eaten appearance to the calvarium and ribs,
nonspecific, compatible with a diagnosis of multiple myeloma.
3. Mildly hypermetabolic nonenlarged mediastinal and bilateral hilar
lymph nodes, nonspecific, more likely reactive.
4. A few scattered small solid bilateral pulmonary nodules, largest
0.4 cm, all below PET resolution. Suggest attention on follow-up
chest CT in 3-6 months.
5.  Aortic Atherosclerosis ([id number].M).

## 2023-03-16 IMAGING — CT CT BIOPSY AND ASPIRATION BONE MARROW
1 of 2 series · 9 of 14 positions shown, 12 images · non-contrast
Comparison: none

INDICATION: Elevated serum immunoglobulin, free light chain level, concern for
myeloma

[Series 2: i-spiral 5.0 br38 · axial · 0.92mm/px · z∈[-211,-102]mm · 9 of 39 slices shown, 12 images]
[im 4/39  soft-tissue]
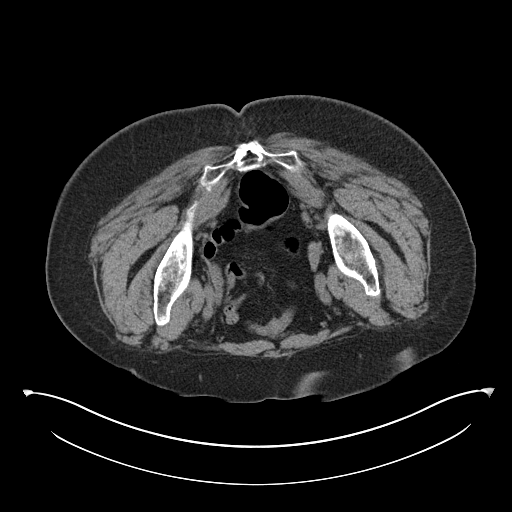
[im 4/39  bone]
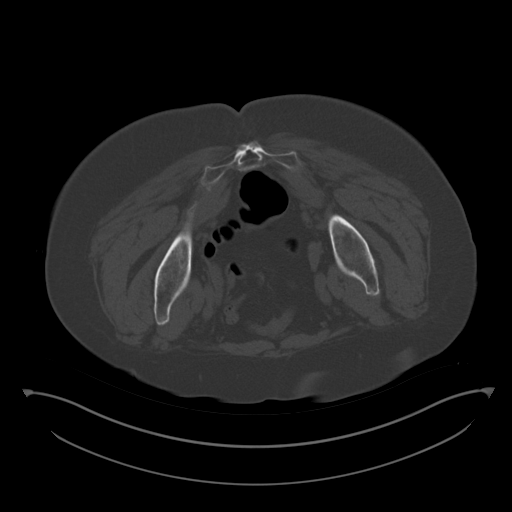
[im 8/39  bone]
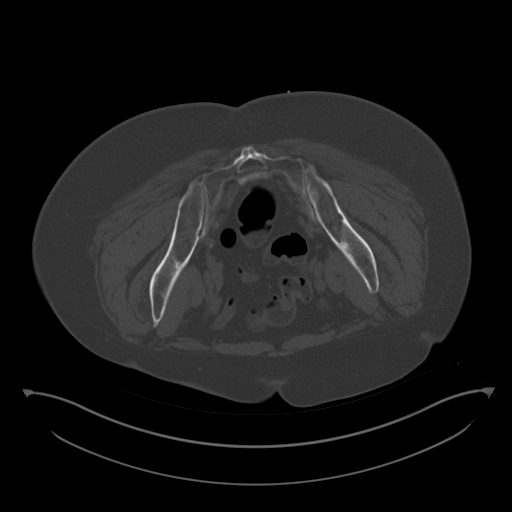
[im 12/39  bone]
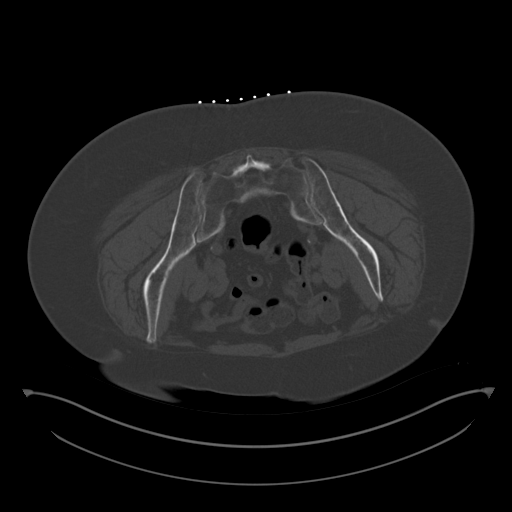
[im 16/39  bone]
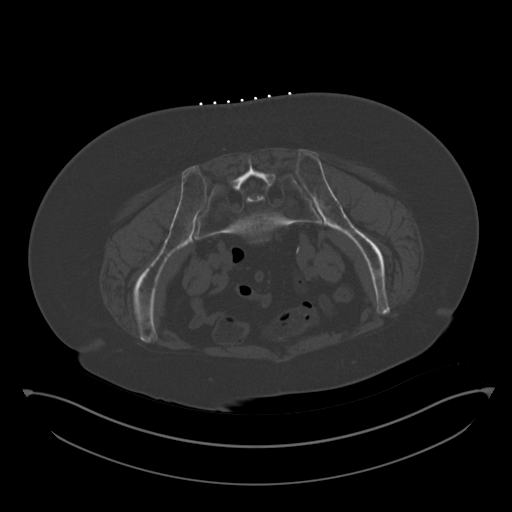
[im 20/39  soft-tissue]
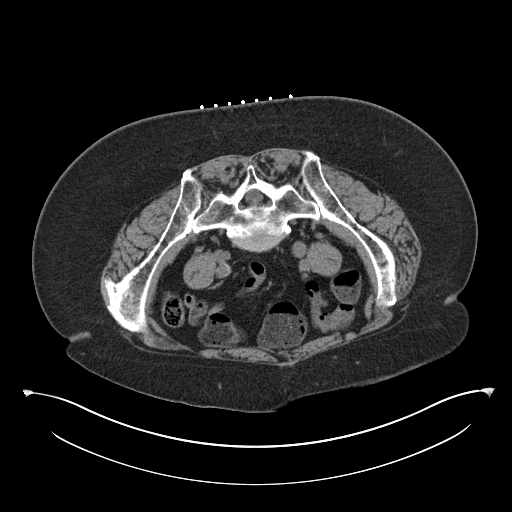
[im 20/39  bone]
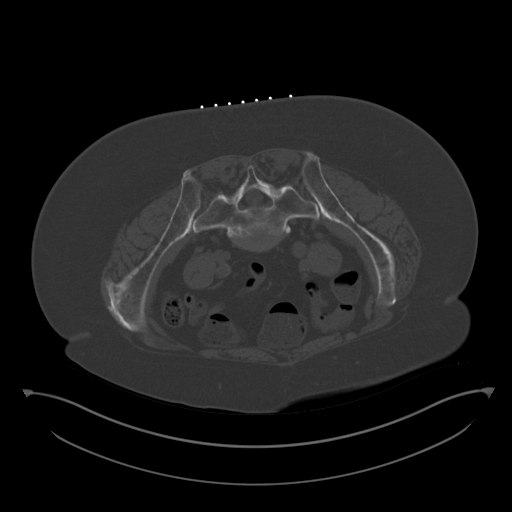
[im 23/39  bone]
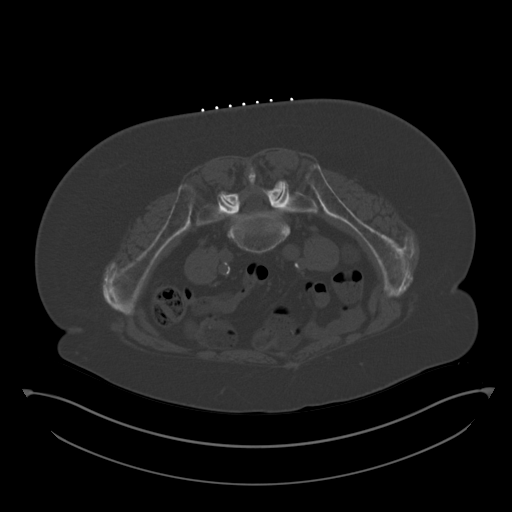
[im 27/39  bone]
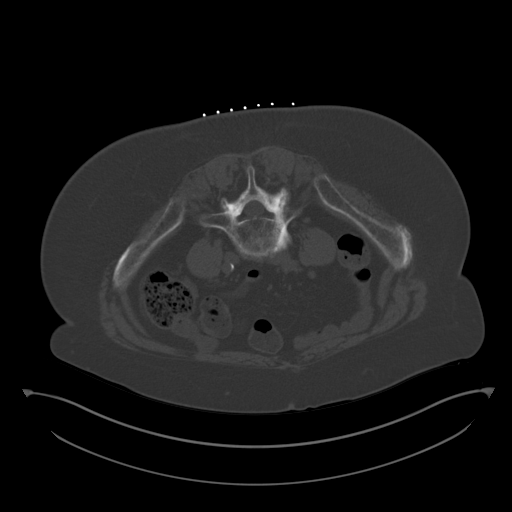
[im 31/39  bone]
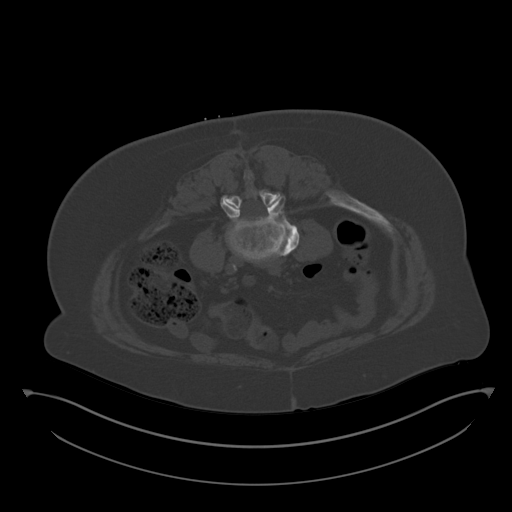
[im 35/39  soft-tissue]
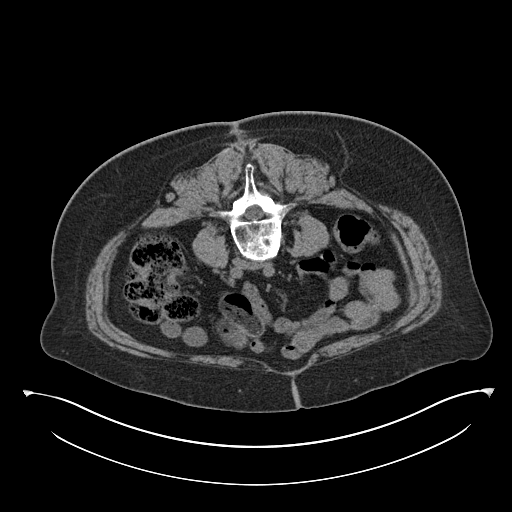
[im 35/39  bone]
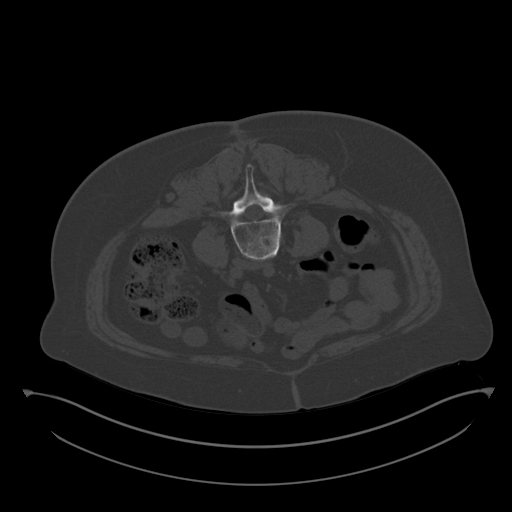

[9 of 14 positions shown; findings below may reference images not displayed]

EXAM:
CT GUIDED RIGHT ILIAC BONE MARROW ASPIRATION AND CORE BIOPSY

Radiologist:  [employer], Nivirus

Guidance:  CT

FLUOROSCOPY TIME:  Fluoroscopy Time: None.

MEDICATIONS:
1% lidocaine local

ANESTHESIA/SEDATION:
2.0 mg IV Versed; 100 mcg IV Fentanyl

Moderate Sedation Time:  15 minute

The patient was continuously monitored during the procedure by the
interventional radiology nurse under my direct supervision.

CONTRAST:  None.

COMPLICATIONS:
None

PROCEDURE:
Informed consent was obtained from the patient following explanation
of the procedure, risks, benefits and alternatives. The patient
understands, agrees and consents for the procedure. All questions
were addressed. A time out was performed.

The patient was positioned prone and non-contrast localization CT
was performed of the pelvis to demonstrate the iliac marrow spaces.

Maximal barrier sterile technique utilized including caps, mask,
sterile gowns, sterile gloves, large sterile drape, hand hygiene,
and Betadine prep.

Under sterile conditions and local anesthesia, an 11 gauge coaxial
bone biopsy needle was advanced into the right iliac marrow space.
Needle position was confirmed with CT imaging. Initially, bone
marrow aspiration was performed. Next, the 11 gauge outer cannula
was utilized to obtain a right iliac bone marrow core biopsy. Needle
was removed. Hemostasis was obtained with compression. The patient
tolerated the procedure well. Samples were prepared with the
cytotechnologist. No immediate complications.
IMPRESSION: CT guided right iliac bone marrow aspiration and core biopsy.

## 2024-02-09 ENCOUNTER — Ambulatory Visit
